# Patient Record
Sex: Male | Born: 1965 | Race: Black or African American | Hispanic: No
Health system: Southern US, Community
[De-identification: ages and names within clinical notes are randomized; demographics above are authoritative.]

## PROBLEM LIST (undated history)

## (undated) DIAGNOSIS — K219 Gastro-esophageal reflux disease without esophagitis: Secondary | ICD-10-CM

## (undated) DIAGNOSIS — E785 Hyperlipidemia, unspecified: Secondary | ICD-10-CM

## (undated) DIAGNOSIS — G4730 Sleep apnea, unspecified: Secondary | ICD-10-CM

## (undated) DIAGNOSIS — G4733 Obstructive sleep apnea (adult) (pediatric): Secondary | ICD-10-CM

## (undated) DIAGNOSIS — R0789 Other chest pain: Secondary | ICD-10-CM

## (undated) DIAGNOSIS — K4090 Unilateral inguinal hernia, without obstruction or gangrene, not specified as recurrent: Secondary | ICD-10-CM

## (undated) DIAGNOSIS — I10 Essential (primary) hypertension: Secondary | ICD-10-CM

## (undated) HISTORY — DX: Gastro-esophageal reflux disease without esophagitis: K21.9

## (undated) HISTORY — PX: HERNIA REPAIR: SHX51

## (undated) HISTORY — DX: Sleep apnea, unspecified: G47.30

## (undated) HISTORY — DX: Obstructive sleep apnea (adult) (pediatric): G47.33

## (undated) HISTORY — PX: COLONOSCOPY: SHX174

## (undated) HISTORY — DX: Unilateral inguinal hernia, without obstruction or gangrene, not specified as recurrent: K40.90

## (undated) HISTORY — PX: POLYPECTOMY: SHX149

## (undated) HISTORY — DX: Hyperlipidemia, unspecified: E78.5

## (undated) HISTORY — DX: Essential (primary) hypertension: I10

## (undated) HISTORY — DX: Other chest pain: R07.89

## (undated) HISTORY — PX: UPPER GASTROINTESTINAL ENDOSCOPY: SHX188

---

## 2004-12-04 ENCOUNTER — Emergency Department (HOSPITAL_COMMUNITY): Admission: EM | Admit: 2004-12-04 | Discharge: 2004-12-04 | Payer: Self-pay | Admitting: Family Medicine

## 2006-03-01 ENCOUNTER — Emergency Department (HOSPITAL_COMMUNITY): Admission: EM | Admit: 2006-03-01 | Discharge: 2006-03-01 | Payer: Self-pay | Admitting: Family Medicine

## 2006-07-10 ENCOUNTER — Emergency Department (HOSPITAL_COMMUNITY): Admission: EM | Admit: 2006-07-10 | Discharge: 2006-07-10 | Payer: Self-pay | Admitting: Family Medicine

## 2010-05-21 ENCOUNTER — Other Ambulatory Visit: Payer: Self-pay | Admitting: Internal Medicine

## 2010-05-21 DIAGNOSIS — R1013 Epigastric pain: Secondary | ICD-10-CM

## 2010-05-21 DIAGNOSIS — R1011 Right upper quadrant pain: Secondary | ICD-10-CM

## 2010-05-22 ENCOUNTER — Ambulatory Visit
Admission: RE | Admit: 2010-05-22 | Discharge: 2010-05-22 | Disposition: A | Discharge status: Home / Self Care | Payer: BC Managed Care – PPO | Source: Ambulatory Visit | Attending: Internal Medicine | Admitting: Internal Medicine

## 2010-05-22 DIAGNOSIS — R1013 Epigastric pain: Secondary | ICD-10-CM

## 2010-05-22 DIAGNOSIS — R1011 Right upper quadrant pain: Secondary | ICD-10-CM

## 2011-11-21 ENCOUNTER — Encounter (INDEPENDENT_AMBULATORY_CARE_PROVIDER_SITE_OTHER): Payer: Self-pay

## 2011-11-21 ENCOUNTER — Encounter (INDEPENDENT_AMBULATORY_CARE_PROVIDER_SITE_OTHER): Payer: Self-pay | Admitting: General Surgery

## 2011-11-21 ENCOUNTER — Ambulatory Visit (INDEPENDENT_AMBULATORY_CARE_PROVIDER_SITE_OTHER): Payer: BC Managed Care – PPO | Admitting: General Surgery

## 2011-11-21 VITALS — BP 120/84 | HR 72 | Temp 98.3°F | Resp 18 | Ht 71.0 in | Wt 206.0 lb

## 2011-11-21 DIAGNOSIS — K4090 Unilateral inguinal hernia, without obstruction or gangrene, not specified as recurrent: Secondary | ICD-10-CM | POA: Insufficient documentation

## 2011-11-21 NOTE — Patient Instructions (Signed)
Try to avoid doing a lot of heavy lifting if you can.

## 2011-11-21 NOTE — Progress Notes (Signed)
Patient ID: Gregory Sanchez, male   DOB: 1965/10/01, 46 y.o.   MRN: [id number]  Chief Complaint  Patient presents with  . New Evaluation    eval hernia    HPI Gregory Sanchez is a 46 y.o. male.   HPI He is referred by Dr. Retta Diones for evaluation of a right inguinal hernia.  He was doing some heavy lifting about 6 weeks ago and noticed a sharp pain in the right groin. Subsequently, he noticed a bulge in the area. He does manually reduce it sometimes when it becomes symptomatic. It does not interfere with his bowel or bladder habits. His father had an inguinal hernia. He denies difficulty with urination or constipation. He is interested in having the hernia repaired.  Past Medical History  Diagnosis Date  . GERD (gastroesophageal reflux disease)   . Hypertension     History reviewed. No pertinent past surgical history.  History reviewed. No pertinent family history.  Social History History  Substance Use Topics  . Smoking status: Current Everyday Smoker -- 0.5 packs/day  . Smokeless tobacco: Not on file  . Alcohol Use: No    No Known Allergies  Current Outpatient Prescriptions  Medication Sig Dispense Refill  . amLODipine-benazepril (LOTREL) 5-20 MG per capsule Take 1 capsule by mouth daily.      Marland Kitchen esomeprazole (NEXIUM) 20 MG capsule Take 40 mg by mouth 2 (two) times daily.         Review of Systems Review of Systems  Constitutional: Negative.   HENT: Negative.   Respiratory: Negative.   Cardiovascular: Negative.   Gastrointestinal: Negative.   Genitourinary: Negative for difficulty urinating.       Right inguinal swelling.  Neurological: Negative.   Hematological: Negative.     Blood pressure 120/84, pulse 72, temperature 98.3 F (36.8 C), resp. rate 18, height 5\' 11"  (1.803 m), weight 206 lb (93.441 kg).  Physical Exam Physical Exam  Constitutional: He appears well-developed and well-nourished. No distress.  HENT:  Head: Normocephalic and atraumatic.    Neck: Neck supple.  Cardiovascular: Normal rate and regular rhythm.   Pulmonary/Chest: Effort normal and breath sounds normal.  Abdominal: Soft. He exhibits no distension and no mass.  Genitourinary:       Reducible right inguinal bulge.  No left inguinal bulge.  No testicular masses.  Skin: Skin is warm and dry.    Data Reviewed None  Assessment    Symptomatic right inguinal hernia. He is interested in repair. We discussed the open repair and a laparoscopic repair both using mesh.    Plan    Open inguinal hernia repair with mesh and placement of On-Q pump.  I have explained the procedure, risks, and aftercare of inguinal hernia repair.  Risks include but are not limited to bleeding, infection, wound problems, anesthesia, recurrence, bladder or intestine injury, urinary retention, testicular dysfunction, chronic pain or numbness in the area, mesh problems.  He seems to understand and agrees to proceed.       Felecity Lemaster J 11/21/2011, 10:29 AM

## 2011-11-29 ENCOUNTER — Other Ambulatory Visit (INDEPENDENT_AMBULATORY_CARE_PROVIDER_SITE_OTHER): Payer: Self-pay | Admitting: General Surgery

## 2011-11-29 ENCOUNTER — Ambulatory Visit
Admission: RE | Admit: 2011-11-29 | Discharge: 2011-11-29 | Disposition: A | Discharge status: Home / Self Care | Payer: BC Managed Care – PPO | Source: Ambulatory Visit | Attending: General Surgery | Admitting: General Surgery

## 2011-11-29 DIAGNOSIS — K4090 Unilateral inguinal hernia, without obstruction or gangrene, not specified as recurrent: Secondary | ICD-10-CM

## 2011-12-13 DIAGNOSIS — K4090 Unilateral inguinal hernia, without obstruction or gangrene, not specified as recurrent: Secondary | ICD-10-CM

## 2011-12-26 ENCOUNTER — Encounter (INDEPENDENT_AMBULATORY_CARE_PROVIDER_SITE_OTHER): Payer: Self-pay

## 2011-12-27 ENCOUNTER — Ambulatory Visit (INDEPENDENT_AMBULATORY_CARE_PROVIDER_SITE_OTHER): Payer: BC Managed Care – PPO | Admitting: General Surgery

## 2011-12-27 ENCOUNTER — Encounter (INDEPENDENT_AMBULATORY_CARE_PROVIDER_SITE_OTHER): Payer: Self-pay | Admitting: General Surgery

## 2011-12-27 VITALS — BP 122/74 | HR 76 | Temp 98.1°F | Resp 13 | Ht 71.0 in | Wt 208.2 lb

## 2011-12-27 DIAGNOSIS — Z9889 Other specified postprocedural states: Secondary | ICD-10-CM

## 2011-12-27 NOTE — Progress Notes (Signed)
He presents for postop followup after open Right inguinal hernia repair with mesh.  Post op pain is improving.  No difficulty voiding or having BMs.  Swelling is decreasing.  P.E.  GU:  Incision clean/dry/intact, swelling is minimal, repair is solid.  Assessment:  Doing well post hernia repair.  Plan:  Continue light activities for 6 weeks postop then slowly start to resume normal activities.  Avoid strenous abdominal exercises for a total of 8 weeks from surgery.  Avoid activities that cause significant discomfort for the long term.  Return visit as needed.

## 2011-12-27 NOTE — Patient Instructions (Signed)
In 4 weeks, you may resume your normal activities as tolerated as we discussed.

## 2012-08-05 ENCOUNTER — Encounter (INDEPENDENT_AMBULATORY_CARE_PROVIDER_SITE_OTHER): Payer: Self-pay | Admitting: General Surgery

## 2012-08-05 ENCOUNTER — Ambulatory Visit (INDEPENDENT_AMBULATORY_CARE_PROVIDER_SITE_OTHER): Payer: BC Managed Care – PPO | Admitting: General Surgery

## 2012-08-05 VITALS — BP 118/88 | HR 64 | Temp 98.8°F | Resp 16 | Ht 71.0 in | Wt 208.8 lb

## 2012-08-05 DIAGNOSIS — IMO0002 Reserved for concepts with insufficient information to code with codable children: Secondary | ICD-10-CM

## 2012-08-05 DIAGNOSIS — S76219A Strain of adductor muscle, fascia and tendon of unspecified thigh, initial encounter: Secondary | ICD-10-CM | POA: Insufficient documentation

## 2012-08-05 NOTE — Patient Instructions (Signed)
You have a right groin strain. Avoid heavy lifting and heavy work for 3-6 weeks until the pain is resolved. Take one  aleve pill twice a day for 2 weeks.  If the pain is noted in 6 weeks, please call and make a followup appointment.

## 2012-08-05 NOTE — Progress Notes (Signed)
Patient ID: Gregory Sanchez, male   DOB: 07-Dec-1965, 48 y.o.   MRN: [id number]  Chief Complaint  Patient presents with  . Follow-up    reck hernia    HPI Gregory Sanchez is a 47 y.o. male.   HPI  He presents today self-referred because of the onset of some sharp right groin pain. This is going on for approximately one to 2 weeks. 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 He tried to place some ice on it. It tends to occur when he is getting up from a sitting position. Once he is walking around it improves. No right inguinal bulges. No dysuria. No testicular pain.  Past Medical History  Diagnosis Date  . GERD (gastroesophageal reflux disease)   . Hypertension   . Inguinal hernia     rt    History reviewed. No pertinent past surgical history.  History reviewed. No pertinent family history.  Social History History  Substance Use Topics  . Smoking status: Former Smoker -- 0.50 packs/day    Quit date: 07/06/2012  . Smokeless tobacco: Not on file  . Alcohol Use: No    Allergies  Allergen Reactions  . Penicillins Hives    Current Outpatient Prescriptions  Medication Sig Dispense Refill  . amLODipine-benazepril (LOTREL) 5-20 MG per capsule Take 1 capsule by mouth daily.      Marland Kitchen esomeprazole (NEXIUM) 20 MG capsule Take 40 mg by mouth 2 (two) times daily.        No current facility-administered medications for this visit.    Review of Systems Review of Systems  Gastrointestinal: Negative for blood in stool and anal bleeding.  Genitourinary: Negative for dysuria, hematuria and testicular pain.    Blood pressure 118/88, pulse 64, temperature 98.8 F (37.1 C), temperature source Oral, resp. rate 16, height 5\' 11"  (1.803 m), weight 208 lb 12.8 oz (94.711 kg).  Physical Exam Physical Exam  Constitutional: He appears well-developed and well-nourished. No distress.  Abdominal: Soft.  No umbilical hernia.  Genitourinary:  Right inguinal scar present.   Note right inguinal bulge. Right inguinal floor is solid without evidence of hernia.    Data Reviewed Previous notes.  Assessment    Acute right groin strain. No evidence of recurrent hernia.     Plan    Nonsteroidal twice a day. Light activity for 3-6 weeks. If he is not better in 6 weeks asked to call back for a followup appointment and repeat examination.       Leilany Digeronimo J 08/05/2012, 10:13 AM

## 2013-12-31 ENCOUNTER — Emergency Department (HOSPITAL_BASED_OUTPATIENT_CLINIC_OR_DEPARTMENT_OTHER)
Admission: EM | Admit: 2013-12-31 | Discharge: 2014-01-01 | Disposition: A | Discharge status: Home / Self Care | Payer: BC Managed Care – PPO | Attending: Emergency Medicine | Admitting: Emergency Medicine

## 2013-12-31 ENCOUNTER — Emergency Department (HOSPITAL_BASED_OUTPATIENT_CLINIC_OR_DEPARTMENT_OTHER): Payer: BC Managed Care – PPO

## 2013-12-31 ENCOUNTER — Encounter (HOSPITAL_BASED_OUTPATIENT_CLINIC_OR_DEPARTMENT_OTHER): Payer: Self-pay | Admitting: Emergency Medicine

## 2013-12-31 DIAGNOSIS — Z880 Allergy status to penicillin: Secondary | ICD-10-CM | POA: Diagnosis not present

## 2013-12-31 DIAGNOSIS — Z87891 Personal history of nicotine dependence: Secondary | ICD-10-CM | POA: Insufficient documentation

## 2013-12-31 DIAGNOSIS — Y990 Civilian activity done for income or pay: Secondary | ICD-10-CM | POA: Diagnosis not present

## 2013-12-31 DIAGNOSIS — Z79899 Other long term (current) drug therapy: Secondary | ICD-10-CM | POA: Diagnosis not present

## 2013-12-31 DIAGNOSIS — X58XXXA Exposure to other specified factors, initial encounter: Secondary | ICD-10-CM | POA: Insufficient documentation

## 2013-12-31 DIAGNOSIS — S8264XA Nondisplaced fracture of lateral malleolus of right fibula, initial encounter for closed fracture: Secondary | ICD-10-CM | POA: Insufficient documentation

## 2013-12-31 DIAGNOSIS — K219 Gastro-esophageal reflux disease without esophagitis: Secondary | ICD-10-CM | POA: Insufficient documentation

## 2013-12-31 DIAGNOSIS — Y9389 Activity, other specified: Secondary | ICD-10-CM | POA: Diagnosis not present

## 2013-12-31 DIAGNOSIS — I10 Essential (primary) hypertension: Secondary | ICD-10-CM | POA: Diagnosis not present

## 2013-12-31 DIAGNOSIS — S99911A Unspecified injury of right ankle, initial encounter: Secondary | ICD-10-CM | POA: Diagnosis present

## 2013-12-31 DIAGNOSIS — S8261XA Displaced fracture of lateral malleolus of right fibula, initial encounter for closed fracture: Secondary | ICD-10-CM

## 2013-12-31 NOTE — ED Notes (Signed)
Pt c/o right ankle injury x 1 day ago 

## 2013-12-31 NOTE — ED Provider Notes (Signed)
CSN: [id number]     Arrival date & time 12/31/13  2140 History  This chart was scribed for Pamella Pert, MD by Starleen Arms, ED Scribe. This patient was seen in room MH07/MH07 and the patient's care was started at 10:08 PM.   Chief Complaint  Patient presents with  . Ankle Injury    Patient is a 48 y.o. male presenting with lower extremity injury. The history is provided by the patient. No language interpreter was used.  Ankle Injury This is a new problem. The current episode started yesterday. The problem occurs constantly. The problem has not changed since onset.Pertinent negatives include no chest pain, no abdominal pain, no headaches and no shortness of breath. The symptoms are aggravated by walking. Nothing relieves the symptoms. He has tried nothing for the symptoms.   HPI Comments: Gregory Sanchez is a 48 y.o. male who presents to the Emergency Department complaining of a twisting right ankle injury sustained yesterday with associated pain and swelling. Patient reports he was at work and rolled over his right ankle and heard a pop.  He is able to walk and bear weight with pain.      Past Medical History  Diagnosis Date  . GERD (gastroesophageal reflux disease)   . Hypertension   . Inguinal hernia     rt   History reviewed. No pertinent past surgical history. History reviewed. No pertinent family history. History  Substance Use Topics  . Smoking status: Former Smoker -- 0.50 packs/day    Quit date: 07/06/2012  . Smokeless tobacco: Not on file  . Alcohol Use: No    Review of Systems  Constitutional: Negative for appetite change and fatigue.  HENT: Negative for congestion, ear discharge and sinus pressure.   Eyes: Negative for discharge.  Respiratory: Negative for cough and shortness of breath.   Cardiovascular: Negative for chest pain.  Gastrointestinal: Negative for abdominal pain and diarrhea.  Genitourinary: Negative for frequency and hematuria.   Musculoskeletal: Positive for arthralgias. Negative for back pain.  Skin: Negative for rash.  Neurological: Negative for seizures and headaches.  Psychiatric/Behavioral: Negative for hallucinations.      Allergies  Penicillins  Home Medications   Prior to Admission medications   Medication Sig Start Date End Date Taking? Authorizing Provider  amLODipine-benazepril (LOTREL) 5-20 MG per capsule Take 1 capsule by mouth daily.    Historical Provider, MD  esomeprazole (NEXIUM) 20 MG capsule Take 40 mg by mouth 2 (two) times daily.     Historical Provider, MD   BP 143/80  Pulse 86  Temp(Src) 98.3 F (36.8 C) (Oral)  Resp 16  Ht 5\' 11"  (1.803 m)  Wt 220 lb (99.791 kg)  BMI 30.70 kg/m2  SpO2 99% Physical Exam  Nursing note and vitals reviewed. Constitutional: He is oriented to person, place, and time. He appears well-developed and well-nourished.  Non-toxic appearance. He does not appear ill. No distress.  HENT:  Head: Normocephalic and atraumatic.  Right Ear: External ear normal.  Left Ear: External ear normal.  Nose: Nose normal. No mucosal edema or rhinorrhea.  Mouth/Throat: Oropharynx is clear and moist and mucous membranes are normal. No dental abscesses or uvula swelling.  Eyes: Conjunctivae and EOM are normal. Pupils are equal, round, and reactive to light.  Neck: Normal range of motion and full passive range of motion without pain. Neck supple.  Cardiovascular: Normal rate, regular rhythm and normal heart sounds.  Exam reveals no gallop and no friction rub.   No murmur  heard. Pulmonary/Chest: Effort normal and breath sounds normal. No respiratory distress. He has no wheezes. He has no rhonchi. He has no rales. He exhibits no tenderness and no crepitus.  Abdominal: Soft. Normal appearance and bowel sounds are normal. He exhibits no distension. There is no tenderness. There is no rebound and no guarding.  Musculoskeletal: Normal range of motion. He exhibits tenderness. He  exhibits no edema.  Mild bruising to the anterior right ankle.  Mild TTP and swelling of the right lateral malleolus. Normal ROM of the right ankle.  2+ pulses in right lower extremity.   Neurological: He is alert and oriented to person, place, and time. He has normal strength. No cranial nerve deficit.  Skin: Skin is warm, dry and intact. No rash noted. No erythema. No pallor.  Psychiatric: He has a normal mood and affect. His speech is normal and behavior is normal. His mood appears not anxious.    ED Course  Procedures (including critical care time)  DIAGNOSTIC STUDIES:   COORDINATION OF CARE:    Labs Review Labs Reviewed - No data to display  Imaging Review Dg Ankle Complete Right  12/31/2013   CLINICAL DATA:  Right ankle injury tonight with lateral pain and swelling. Initial encounter.  EXAM: RIGHT ANKLE - COMPLETE 3+ VIEW  COMPARISON:  None.  FINDINGS: There is soft tissue swelling laterally with an underlying nondisplaced lateral malleolar fracture. No other bony or joint abnormality is identified.  IMPRESSION: Nondisplaced lateral malleolar fracture.   Electronically Signed   By: Inge Rise M.D.   On: 12/31/2013 22:11     EKG Interpretation None      MDM   Final diagnoses:  Lateral malleolar fracture, right, closed, initial encounter    11:30 PM 48 y.o. male here after twisting his ankle yesterday. He was found to have a nondisplaced lateral malleolus fracture. Will place in a Cam Walker and have him followup with orthopedics. He has been ambulatory for the last day with minimal pain.  11:30 PM:  I have discussed the diagnosis/risks/treatment options with the patient and believe the pt to be eligible for discharge home to follow-up with Dr. Tonita Cong. We also discussed returning to the ED immediately if new or worsening sx occur. We discussed the sx which are most concerning (e.g., worsening pain) that necessitate immediate return. Medications administered to the  patient during their visit and any new prescriptions provided to the patient are listed below.  Medications given during this visit Medications - No data to display  New Prescriptions   No medications on file      I personally performed the services described in this documentation, which was scribed in my presence. The recorded information has been reviewed and is accurate.    Pamella Pert, MD 01/01/14 [phone]

## 2013-12-31 NOTE — Discharge Instructions (Signed)
Ankle Fracture  A fracture is a break in a bone. The ankle joint is made up of three bones. These include the lower (distal)sections of your lower leg bones, called the tibia and fibula, along with a bone in your foot, called the talus. Depending on how bad the break is and if more than one ankle joint bone is broken, a cast or splint is used to protect and keep your injured bone from moving while it heals. Sometimes, surgery is required to help the fracture heal properly.   There are two general types of fractures:   Stable fracture. This includes a single fracture line through one bone, with no injury to ankle ligaments. A fracture of the talus that does not have any displacement (movement of the bone on either side of the fracture line) is also stable.   Unstable fracture. This includes more than one fracture line through one or more bones in the ankle joint. It also includes fractures that have displacement of the bone on either side of the fracture line.  CAUSES   A direct blow to the ankle.    Quickly and severely twisting your ankle.   Trauma, such as a car accident or falling from a significant height.  RISK FACTORS  You may be at a higher risk of ankle fracture if:   You have certain medical conditions.   You are involved in high-impact sports.   You are involved in a high-impact car accident.  SIGNS AND SYMPTOMS    Tender and swollen ankle.   Bruising around the injured ankle.   Pain on movement of the ankle.   Difficulty walking or putting weight on the ankle.   A cold foot below the site of the ankle injury. This can occur if the blood vessels passing through your injured ankle were also damaged.   Numbness in the foot below the site of the ankle injury.  DIAGNOSIS   An ankle fracture is usually diagnosed with a physical exam and X-rays. A CT scan may also be required for complex fractures.  TREATMENT   Stable fractures are treated with a cast or splint and using crutches to avoid putting  weight on your injured ankle. This is followed by an ankle strengthening program. Some patients require a special type of cast, depending on other medical problems they may have. Unstable fractures require surgery to ensure the bones heal properly. Your health care provider will tell you what type of fracture you have and the best treatment for your condition.  HOME CARE INSTRUCTIONS    Review correct crutch use with your health care provider and use your crutches as directed. Safe use of crutches is extremely important. Misuse of crutches can cause you to fall or cause injury to nerves in your hands or armpits.   Do not put weight or pressure on the injured ankle until directed by your health care provider.   To lessen the swelling, keep the injured leg elevated while sitting or lying down.   Apply ice to the injured area:   Put ice in a plastic bag.   Place a towel between your cast and the bag.   Leave the ice on for 20 minutes, 2-3 times a day.   If you have a plaster or fiberglass cast:   Do not try to scratch the skin under the cast with any objects. This can increase your risk of skin infection.   Check the skin around the cast every day. You   may put lotion on any red or sore areas.   Keep your cast dry and clean.   If you have a plaster splint:   Wear the splint as directed.   You may loosen the elastic around the splint if your toes become numb, tingle, or turn cold or blue.   Do not put pressure on any part of your cast or splint; it may break. Rest your cast only on a pillow the first 24 hours until it is fully hardened.   Your cast or splint can be protected during bathing with a plastic bag sealed to your skin with medical tape. Do not lower the cast or splint into water.   Take medicines as directed by your health care provider. Only take over-the-counter or prescription medicines for pain, discomfort, or fever as directed by your health care provider.   Do not drive a vehicle until  your health care provider specifically tells you it is safe to do so.   If your health care provider has given you a follow-up appointment, it is very important to keep that appointment. Not keeping the appointment could result in a chronic or permanent injury, pain, and disability. If you have any problem keeping the appointment, call the facility for assistance.  SEEK MEDICAL CARE IF:  You develop increased swelling or discomfort.  SEEK IMMEDIATE MEDICAL CARE IF:    Your cast gets damaged or breaks.   You have continued severe pain.   You develop new pain or swelling after the cast was put on.   Your skin or toenails below the injury turn blue or gray.   Your skin or toenails below the injury feel cold, numb, or have loss of sensitivity to touch.   There is a bad smell or pus draining from under the cast.  MAKE SURE YOU:    Understand these instructions.   Will watch your condition.   Will get help right away if you are not doing well or get worse.  Document Released: 03/01/2000 Document Revised: 03/09/2013 Document Reviewed: 10/01/2012  Exit[employer] Patient Information 2015 [employer]. This information is not intended to replace advice given to you by your health care provider. Make sure you discuss any questions you have with your health care provider.

## 2014-10-27 ENCOUNTER — Ambulatory Visit (INDEPENDENT_AMBULATORY_CARE_PROVIDER_SITE_OTHER): Payer: BLUE CROSS/BLUE SHIELD | Admitting: Neurology

## 2014-10-27 ENCOUNTER — Encounter: Payer: Self-pay | Admitting: Neurology

## 2014-10-27 VITALS — BP 122/82 | HR 86 | Resp 20 | Ht 71.0 in | Wt 230.0 lb

## 2014-10-27 DIAGNOSIS — G4710 Hypersomnia, unspecified: Secondary | ICD-10-CM | POA: Diagnosis not present

## 2014-10-27 DIAGNOSIS — G4733 Obstructive sleep apnea (adult) (pediatric): Secondary | ICD-10-CM | POA: Diagnosis not present

## 2014-10-27 DIAGNOSIS — G4730 Sleep apnea, unspecified: Secondary | ICD-10-CM

## 2014-10-27 DIAGNOSIS — R0683 Snoring: Secondary | ICD-10-CM | POA: Diagnosis not present

## 2014-10-27 NOTE — Patient Instructions (Signed)
Sleep Apnea  Sleep apnea is a sleep disorder characterized by abnormal pauses in breathing while you sleep. When your breathing pauses, the level of oxygen in your blood decreases. This causes you to move out of deep sleep and into light sleep. As a result, your quality of sleep is poor, and the system that carries your blood throughout your body (cardiovascular system) experiences stress. If sleep apnea remains untreated, the following conditions can develop:  High blood pressure (hypertension).  Coronary artery disease.  Inability to achieve or maintain an erection (impotence).  Impairment of your thought process (cognitive dysfunction). There are three types of sleep apnea: 1. Obstructive sleep apnea--Pauses in breathing during sleep because of a blocked airway. 2. Central sleep apnea--Pauses in breathing during sleep because the area of the brain that controls your breathing does not send the correct signals to the muscles that control breathing. 3. Mixed sleep apnea--A combination of both obstructive and central sleep apnea. RISK FACTORS The following risk factors can increase your risk of developing sleep apnea:  Being overweight.  Smoking.  Having narrow passages in your nose and throat.  Being of older age.  Being male.  Alcohol use.  Sedative and tranquilizer use.  Ethnicity. Among individuals younger than 35 years, African Americans are at increased risk of sleep apnea. SYMPTOMS   Difficulty staying asleep.  Daytime sleepiness and fatigue.  Loss of energy.  Irritability.  Loud, heavy snoring.  Morning headaches.  Trouble concentrating.  Forgetfulness.  Decreased interest in sex. DIAGNOSIS  In order to diagnose sleep apnea, your caregiver will perform a physical examination. Your caregiver may suggest that you take a home sleep test. Your caregiver may also recommend that you spend the night in a sleep lab. In the sleep lab, several monitors record  information about your heart, lungs, and brain while you sleep. Your leg and arm movements and blood oxygen level are also recorded. TREATMENT The following actions may help to resolve mild sleep apnea:  Sleeping on your side.   Using a decongestant if you have nasal congestion.   Avoiding the use of depressants, including alcohol, sedatives, and narcotics.   Losing weight and modifying your diet if you are overweight. There also are devices and treatments to help open your airway:  Oral appliances. These are custom-made mouthpieces that shift your lower jaw forward and slightly open your bite. This opens your airway.  Devices that create positive airway pressure. This positive pressure "splints" your airway open to help you breathe better during sleep. The following devices create positive airway pressure:  Continuous positive airway pressure (CPAP) device. The CPAP device creates a continuous level of air pressure with an air pump. The air is delivered to your airway through a mask while you sleep. This continuous pressure keeps your airway open.  Nasal expiratory positive airway pressure (EPAP) device. The EPAP device creates positive air pressure as you exhale. The device consists of single-use valves, which are inserted into each nostril and held in place by adhesive. The valves create very little resistance when you inhale but create much more resistance when you exhale. That increased resistance creates the positive airway pressure. This positive pressure while you exhale keeps your airway open, making it easier to breath when you inhale again.  Bilevel positive airway pressure (BPAP) device. The BPAP device is used mainly in patients with central sleep apnea. This device is similar to the CPAP device because it also uses an air pump to deliver continuous air pressure   through a mask. However, with the BPAP machine, the pressure is set at two different levels. The pressure when you  exhale is lower than the pressure when you inhale.  Surgery. Typically, surgery is only done if you cannot comply with less invasive treatments or if the less invasive treatments do not improve your condition. Surgery involves removing excess tissue in your airway to create a wider passage way. Document Released: 02/22/2002 Document Revised: 06/29/2012 Document Reviewed: 07/11/2011 [employer] Patient Information 2015 [employer]. This information is not intended to replace advice given to you by your health care provider. Make sure you discuss any questions you have with your health care provider.  

## 2014-10-27 NOTE — Progress Notes (Signed)
[facility]   Provider:  Larey Seat, M D  Referring Provider: Marton Redwood, MD Primary Care Physician:  Marton Redwood, MD  Chief Complaint  Patient presents with  . sleep consult    rm 11, alone, snoring    HPI:  Gregory Sanchez is a 49 y.o. male , seen here as a referral from Dr. 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 He reports that he has for a long time felt not restored or refreshed in the morning -on the contrary he feels tired, as if he needs more sleep. Some nights he reports tossing and turning in bed not feeling able to enter sleep. He was already once evaluated for sleep apnea and referred to [facility] [place]. A little over 6 years ago on 7-20 7-10 he underwent a diagnostic polysomnography at the time his BMI was 29.4, his neck circumference 16 inches, and his Epworth sleepiness score endorsed at 4 points. He had reported waking up this morning headaches feeling drowsy and having a dry mouth and he had been told that he snored he also had frequent bathroom breaks. His sleep study revealed only an AHI of 5.8 and in supine sleep position of 16.9. He also had frequent periodic limb movements that caused arousals at a frequency of 6.9 per hour of sleep and for therefore clinically significant at the time. Commended for him to avoid sleeping in supine position.  Over the last 6 years the patient reports having gained some weight he was referred with a body weight of 231 pounds and he was 20 pounds lighter 6 years ago. His snoring has increased and is feeling of tiredness has increased.  Sleep habits are as follows: The patient prefers to sleep on his stomach or on his side, he usually goes to bed around 9 to 9:30 PM. On average it'll take him 20-30 minutes to fall asleep. He will wake up between 1 AM  and 1:30 AM to go to the bathroom, usually just once at night. He feels that it is difficult for him to fall asleep again after the bathroom break. He usually goes to bed and just lays there but he does not leaves the bedroom or distract himself with any other measures. He does not watch TV he does not read. His bedroom is core, quiet and dark. He rises at 4:00 in the morning, he relies on an alarm and is not waking up spontaneously. He does not take breakfast before commuting to work. He works from 5 AM to 5 PM in [employer], mostly outdoors.  He shares a bedroom with his girlfriend who has observed him snoring and moving. Kicking frequently, but she has not reported apneas.   Sleep medical history and family sleep history:  All family members snore, but nobody carries the diagnosis of OSA. Social history:  Lives with girlfriend, no caffeine intake, no ETOH, non smoker.   Review of Systems: Out of a complete 14 system review, the patient complains of only the following symptoms, and all other reviewed systems are negative. Snoring, feeling a decreased level of energy, not enough sleep weight gain. Epworth sleepiness score endorsed at 19 points which is excessive, fatigue was not endorsed as high, at 25 points. He denies any symptoms of depression.    Social History   Social History  . Marital  Status: Single    Spouse Name: N/A  . Number of Children: N/A  . Years of Education: N/A   Occupational History  . Not on file.   Social History Main Topics  . Smoking status: Former Smoker -- 0.50 packs/day    Quit date: 07/06/2012  . Smokeless tobacco: Not on file  . Alcohol Use: No  . Drug Use: No  . Sexual Activity: Not on file   Other Topics Concern  . Not on file   Social History Narrative   Denies caffeine use.    Family History  Problem Relation Age of Onset  . Hypertension Mother   . Hypertension Father     Past Medical History  Diagnosis Date  . GERD (gastroesophageal  reflux disease)   . Hypertension   . Inguinal hernia     rt  . Atypical chest pain   . Sleep apnea, obstructive     Past Surgical History  Procedure Laterality Date  . Hernia repair      Current Outpatient Prescriptions  Medication Sig Dispense Refill  . amLODipine-benazepril (LOTREL) 5-20 MG per capsule Take 1 capsule by mouth daily.    Marland Kitchen aspirin 325 MG tablet Take 325 mg by mouth daily.    Marland Kitchen esomeprazole (NEXIUM) 20 MG capsule Take 40 mg by mouth 2 (two) times daily.      No current facility-administered medications for this visit.    Allergies as of 10/27/2014 - Review Complete 10/27/2014  Allergen Reaction Noted  . Penicillins Hives 08/05/2012    Vitals: BP 122/82 mmHg  Pulse 86  Resp 20  Ht 5\' 11"  (1.803 m)  Wt 230 lb (104.327 kg)  BMI 32.09 kg/m2 Last Weight:  Wt Readings from Last 1 Encounters:  10/27/14 230 lb (104.327 kg)   TFT:DDUK mass index is 32.09 kg/(m^2).     Last Height:   Ht Readings from Last 1 Encounters:  10/27/14 5\' 11"  (1.803 m)    Physical exam:  General: The patient is awake, alert and appears not in acute distress. The patient is well groomed. Head: Normocephalic, atraumatic. Neck is supple. Mallampati 4 ,  neck circumference:17. Nasal airflow unrestricted , TMJ click is not  evident . Retrognathia is seen.  Cardiovascular:  Regular rate and rhythm , without  murmurs or carotid bruit, and without distended neck veins. Respiratory: Lungs are clear to auscultation. Skin:  Without evidence of edema, or rash Trunk: BMI is elevated . The patient's posture is erect. Neurologic exam : The patient is awake and alert, oriented to place and time.   Attention span & concentration ability appears normal.  Speech is fluent,  without dysarthria, dysphonia or aphasia.  Mood and affect are appropriate.  Cranial nerves: Pupils are equal and briskly reactive to light. Funduscopic exam without   evidence of pallor or edema. Extraocular movements  in  vertical and horizontal planes intact and without nystagmus. Visual fields by finger perimetry are intact. Hearing to finger rub intact.   Facial sensation intact to fine touch.  Facial motor strength is symmetric and tongue and uvula move midline. Shoulder shrug was symmetrical.   Motor exam:  Normal tone, muscle bulk and symmetric strength in all extremities.  Sensory:  Fine touch, pinprick and vibration were tested in all extremities. Proprioception tested in the upper extremities was normal.  Coordination: Rapid alternating movements in the fingers/hands was normal. Finger-to-nose maneuver  normal without evidence of ataxia, dysmetria or tremor.  Gait and station: Patient walks without assistive  device and is able unassisted to climb up to the exam table. Strength within normal limits.  Stance is stable and normal.  Toe and heel stand were tested .  Turns with 3  Steps. Romberg testing is  negative.  Deep tendon reflexes: in the  upper and lower extremities are symmetric and intact. Babinski maneuver response is  downgoing.  The patient was advised of the nature of the diagnosed sleep disorder , the treatment options and risks for general a health and wellness arising from not treating the condition.  I spent more than 30 minutes of face to face time with the patient. Greater than 50% of time was spent in counseling and coordination of care. We have discussed the diagnosis and differential and I answered the patient's questions.     Assessment:  After physical and neurologic examination, review of laboratory studies,  Personal review of imaging studies, reports of other /same  Imaging studies ,  Results of polysomnography/ neurophysiology testing and pre-existing records as far as provided , my assessment is   1) Mr. Wafer is at a higher risk of having sleep apnea that he was 6 years ago. His risk factors are related to weight gain and age male gender and the finding of retrognathia. His  girlfriend's report of frequently kicking and moving in bed as well as snoring loudly are  indications of a higher risk of sleep apnea being present. He doesn't report morning headaches, as he had 6 years ago, and no longer nocturia.   Plan:  Treatment plan and additional workup :  PSG Split , AHI 15 and score at 3% for [employer]. Rv with me after test . Should again a mild form of sleep apnea without significant oxygen desaturation be found, Mr. Weathers could be an ideal candidate for a dental device that would advance his mandibular. Again the treatment options for sleep apnea, if found doing sleep study, vary by the degree, and comorbidities.     Cc Dr Ivin Booty Adelaine Roppolo MD  10/27/2014   CC: Marton Redwood, [place] [place], [place] [id number]

## 2014-11-15 ENCOUNTER — Ambulatory Visit (INDEPENDENT_AMBULATORY_CARE_PROVIDER_SITE_OTHER): Payer: BLUE CROSS/BLUE SHIELD | Admitting: Neurology

## 2014-11-15 DIAGNOSIS — G4733 Obstructive sleep apnea (adult) (pediatric): Secondary | ICD-10-CM | POA: Diagnosis not present

## 2014-11-15 DIAGNOSIS — G4730 Sleep apnea, unspecified: Secondary | ICD-10-CM

## 2014-11-15 DIAGNOSIS — G4710 Hypersomnia, unspecified: Secondary | ICD-10-CM

## 2014-11-15 DIAGNOSIS — R0683 Snoring: Secondary | ICD-10-CM

## 2014-11-15 NOTE — Sleep Study (Signed)
Please see the scanned sleep study interpretation located in the Procedure tab within the Chart Review section. 

## 2014-11-16 ENCOUNTER — Telehealth: Payer: Self-pay

## 2014-11-16 DIAGNOSIS — G4733 Obstructive sleep apnea (adult) (pediatric): Secondary | ICD-10-CM

## 2014-11-16 NOTE — Telephone Encounter (Signed)
Called and spoke to pt regarding his sleep study results. Advised him that osa was seen in his study and Dr. Brett Fairy recommends starting cpap to treat the osa. Pt is willing to proceed. I advised pt that I will send his information and an order to a DME and they will contact him for set up. I advised pt to wear cpap at least four or more hours nightly. An appt was made with pt for 10/31 at 8:00 to follow up with Dr. Brett Fairy for insurance purposes. Pt verbalized understanding to arrive 15 minutes early to his appt and bring his cpap.

## 2015-01-10 ENCOUNTER — Telehealth: Payer: Self-pay

## 2015-01-10 NOTE — Telephone Encounter (Signed)
Spoke to [facility]. They advised me that pt declined to set up cpap because of financial constraints, and he would call back and make an appt to get it set up when he got his finances in order.  I called pt to discuss if he wanted to cancel his appt for 10/31 since he has not started his cpap. No answer, left a message asking him to call me back.

## 2015-01-16 ENCOUNTER — Ambulatory Visit: Payer: Self-pay | Admitting: Neurology

## 2015-01-16 ENCOUNTER — Telehealth: Payer: Self-pay

## 2015-01-16 NOTE — Telephone Encounter (Signed)
Pt did not show for their appt with Dr. Dohmeier today.  

## 2015-01-17 ENCOUNTER — Encounter: Payer: Self-pay | Admitting: Neurology

## 2015-04-27 ENCOUNTER — Emergency Department (HOSPITAL_COMMUNITY): Payer: BLUE CROSS/BLUE SHIELD

## 2015-04-27 ENCOUNTER — Emergency Department (HOSPITAL_COMMUNITY)
Admission: EM | Admit: 2015-04-27 | Discharge: 2015-04-27 | Disposition: A | Discharge status: Home / Self Care | Payer: BLUE CROSS/BLUE SHIELD | Attending: Emergency Medicine | Admitting: Emergency Medicine

## 2015-04-27 ENCOUNTER — Encounter (HOSPITAL_COMMUNITY): Payer: Self-pay | Admitting: Emergency Medicine

## 2015-04-27 DIAGNOSIS — Z880 Allergy status to penicillin: Secondary | ICD-10-CM | POA: Diagnosis not present

## 2015-04-27 DIAGNOSIS — Z8669 Personal history of other diseases of the nervous system and sense organs: Secondary | ICD-10-CM | POA: Diagnosis not present

## 2015-04-27 DIAGNOSIS — I10 Essential (primary) hypertension: Secondary | ICD-10-CM | POA: Diagnosis not present

## 2015-04-27 DIAGNOSIS — Z7982 Long term (current) use of aspirin: Secondary | ICD-10-CM | POA: Diagnosis not present

## 2015-04-27 DIAGNOSIS — K219 Gastro-esophageal reflux disease without esophagitis: Secondary | ICD-10-CM | POA: Insufficient documentation

## 2015-04-27 DIAGNOSIS — Z79899 Other long term (current) drug therapy: Secondary | ICD-10-CM | POA: Insufficient documentation

## 2015-04-27 DIAGNOSIS — Z87891 Personal history of nicotine dependence: Secondary | ICD-10-CM | POA: Diagnosis not present

## 2015-04-27 DIAGNOSIS — R079 Chest pain, unspecified: Secondary | ICD-10-CM | POA: Diagnosis present

## 2015-04-27 DIAGNOSIS — R0789 Other chest pain: Secondary | ICD-10-CM

## 2015-04-27 LAB — BASIC METABOLIC PANEL
Anion gap: 11 (ref 5–15)
BUN: 16 mg/dL (ref 6–20)
CALCIUM: 9.3 mg/dL (ref 8.9–10.3)
CO2: 25 mmol/L (ref 22–32)
CREATININE: 1.12 mg/dL (ref 0.61–1.24)
Chloride: 102 mmol/L (ref 101–111)
Glucose, Bld: 100 mg/dL — ABNORMAL HIGH (ref 65–99)
Potassium: 4.1 mmol/L (ref 3.5–5.1)
SODIUM: 138 mmol/L (ref 135–145)

## 2015-04-27 LAB — CBC
HCT: 40.3 % (ref 39.0–52.0)
Hemoglobin: 13.6 g/dL (ref 13.0–17.0)
MCH: 29.3 pg (ref 26.0–34.0)
MCHC: 33.7 g/dL (ref 30.0–36.0)
MCV: 86.9 fL (ref 78.0–100.0)
PLATELETS: 312 10*3/uL (ref 150–400)
RBC: 4.64 MIL/uL (ref 4.22–5.81)
RDW: 13.7 % (ref 11.5–15.5)
WBC: 5.7 10*3/uL (ref 4.0–10.5)

## 2015-04-27 LAB — I-STAT TROPONIN, ED: Troponin i, poc: 0 ng/mL (ref 0.00–0.08)

## 2015-04-27 LAB — TROPONIN I: Troponin I: <0.03 ng/mL (ref <0.031)

## 2015-04-27 MED ORDER — IBUPROFEN 600 MG PO TABS: 600.0000 mg | ORAL_TABLET | Freq: Four times a day (QID) | ORAL | Status: DC | PRN

## 2015-04-27 NOTE — ED Notes (Signed)
Per EMS:  Pt presents to ED from [place] after experiencing chest pain on the left side, sharp radiating down left arm.  Pt sts chest pain began last night, was intermittent.  No cardiac history, family history of MI before 72.  Pt given ASA and 1 nitro with chest pain resolution.

## 2015-04-27 NOTE — Discharge Instructions (Signed)
You were seen in the ED for chest pain.  You had an EKG, chest xray, and labs that did not suggest that this was coming from your heart.  However, I recommend that you see your primary care doctor in the next week.  You will likely need a referral for an outpatient cardiac stress test.   Nonspecific Chest Pain It is often hard to find the cause of chest pain. There is always a chance that your pain could be related to something serious, such as a heart attack or a blood clot in your lungs. Chest pain can also be caused by conditions that are not life-threatening. If you have chest pain, it is very important to follow up with your doctor.  HOME CARE  If you were prescribed an antibiotic medicine, finish it all even if you start to feel better.  Avoid any activities that cause chest pain.  Do not use any tobacco products, including cigarettes, chewing tobacco, or electronic cigarettes. If you need help quitting, ask your doctor.  Do not drink alcohol.  Take medicines only as told by your doctor.  Keep all follow-up visits as told by your doctor. This is important. This includes any further testing if your chest pain does not go away.  Your doctor may tell you to keep your head raised (elevated) while you sleep.  Make lifestyle changes as told by your doctor. These may include:  Getting regular exercise. Ask your doctor to suggest some activities that are safe for you.  Eating a heart-healthy diet. Your doctor or a diet specialist (dietitian) can help you to learn healthy eating options.  Maintaining a healthy weight.  Managing diabetes, if necessary.  Reducing stress. GET HELP IF:  Your chest pain does not go away, even after treatment.  You have a rash with blisters on your chest.  You have a fever. GET HELP RIGHT AWAY IF:  Your chest pain is worse.  You have an increasing cough, or you cough up blood.  You have severe belly (abdominal) pain.  You feel extremely  weak.  You pass out (faint).  You have chills.  You have sudden, unexplained chest discomfort.  You have sudden, unexplained discomfort in your arms, back, neck, or jaw.  You have shortness of breath at any time.  You suddenly start to sweat, or your skin gets clammy.  You feel nauseous.  You vomit.  You suddenly feel light-headed or dizzy.  Your heart begins to beat quickly, or it feels like it is skipping beats. These symptoms may be an emergency. Do not wait to see if the symptoms will go away. Get medical help right away. Call your local emergency services (911 in the [place] Do not drive yourself to the hospital.   This information is not intended to replace advice given to you by your health care provider. Make sure you discuss any questions you have with your health care provider.   Document Released: 08/21/2007 Document Revised: 03/25/2014 Document Reviewed: 10/08/2013 Elsevier Interactive Patient Education [employer].

## 2015-04-27 NOTE — ED Provider Notes (Signed)
I saw and evaluated the patient, reviewed the resident's note and I agree with the findings and plan. Please see associated encounter note.   EKG Interpretation   Date/Time:  Thursday April 27 2015 16:40:57 EST Ventricular Rate:  71 PR Interval:  176 QRS Duration: 88 QT Interval:  387 QTC Calculation: 420 R Axis:   51 Text Interpretation:  Sinus rhythm Benign early repolarization No  significant change since last tracing Confirmed by Carmaleta Youngers MD, Sabree Nuon  [phone]) on 04/27/2015 4:50:02 PM      50 y.o. male presents with atypical chest pain described as left-sided, sharp, reproducible, with pain and radiation distally over his arm made worse with direct pressure over his anterior shoulder. EKG shows early re-pole unchanged from prior. Heart score is 4 with this finding but given highly atypical features and 2 negative troponins he is appropriate for outpatient follow-up on a trial of NSAIDs.  Leo Grosser, MD 04/28/15 [place]

## 2015-04-27 NOTE — ED Provider Notes (Signed)
CSN: [id number]     Arrival date & time 04/27/15  1633 History   First MD Initiated Contact with Patient 04/27/15 1642     Chief Complaint  Patient presents with  . Chest Pain   HPI Comments: Per EMS: Pt presents to ED from [place] after experiencing chest pain on the left side, sharp radiating down left arm. Pt sts chest pain began last night, was intermittent. No cardiac history, family history of MI before 87. Pt given ASA and 1 nitro with chest pain resolution.  Patient reports that chest pain started last evening.  He described CP as left sided radiating down his L arm.  Pain was worse with activity.  It was intermittent and returned this morning at work.  CP relieved by Nitro and ASA given by EMS. No SOB, nausea, vomiting, diaphoresis, visual disturbance, weakness.  He reports a family h/o MI in his mother at 37 yo.  His brother had a CVA at 99 yo.  Patient is treated for HTN and acid reflux.  Does not smoke, drink or use illicit substances.  Patient is a 50 y.o. male presenting with chest pain. The history is provided by the patient. No language interpreter was used.  Chest Pain Associated symptoms: no abdominal pain, no diaphoresis, no dizziness, no nausea, no numbness, no shortness of breath, not vomiting and no weakness     Past Medical History  Diagnosis Date  . GERD (gastroesophageal reflux disease)   . Hypertension   . Inguinal hernia     rt  . Atypical chest pain   . Sleep apnea, obstructive    Past Surgical History  Procedure Laterality Date  . Hernia repair     Family History  Problem Relation Age of Onset  . Hypertension Mother   . Hypertension Father    Social History  Substance Use Topics  . Smoking status: Former Smoker -- 0.50 packs/day    Quit date: 07/06/2012  . Smokeless tobacco: None  . Alcohol Use: No    Review of Systems  Constitutional: Negative for diaphoresis.  Eyes: Negative for visual disturbance.  Respiratory: Negative for shortness of  breath.   Cardiovascular: Positive for chest pain.  Gastrointestinal: Negative for nausea, vomiting and abdominal pain.  Musculoskeletal: Positive for myalgias (left arm pain that has resolved).  Neurological: Negative for dizziness, weakness and numbness.   Allergies  Penicillins  Home Medications   Prior to Admission medications   Medication Sig Start Date End Date Taking? Authorizing Provider  amLODipine-benazepril (LOTREL) 5-20 MG per capsule Take 1 capsule by mouth daily.   Yes Historical Provider, MD  aspirin 325 MG tablet Take 325 mg by mouth daily.   Yes Historical Provider, MD  esomeprazole (NEXIUM) 20 MG capsule Take 20 mg by mouth 2 (two) times daily.    Yes Historical Provider, MD  Multiple Vitamin (MULTIVITAMIN WITH MINERALS) TABS tablet Take 1 tablet by mouth daily.   Yes Historical Provider, MD  triamcinolone cream (KENALOG) 0.1 % Apply 1 application topically daily as needed (affected area).  04/21/15  Yes Historical Provider, MD   BP 107/90 mmHg  Pulse 69  Resp 18  Ht 5\' 11"  (1.803 m)  Wt 102.059 kg  BMI 31.39 kg/m2  SpO2 100% Physical Exam  Constitutional: He is oriented to person, place, and time. He appears well-developed and well-nourished. No distress.  HENT:  Head: Normocephalic and atraumatic.  Mouth/Throat: Oropharynx is clear and moist. No oropharyngeal exudate.  Eyes: Conjunctivae and EOM are normal.  Pupils are equal, round, and reactive to light.  Neck: Normal range of motion. Neck supple.  Cardiovascular: Normal rate, regular rhythm, normal heart sounds and intact distal pulses.   No murmur heard. Pulmonary/Chest: Effort normal and breath sounds normal. No respiratory distress. He has no wheezes.  Abdominal: Soft. Bowel sounds are normal. There is no tenderness.  Musculoskeletal: Normal range of motion. He exhibits no edema.  Neurological: He is alert and oriented to person, place, and time.  Skin: Skin is warm and dry. He is not diaphoretic.   Psychiatric: He has a normal mood and affect.    ED Course  Procedures (including critical care time) Labs Review Labs Reviewed  BASIC METABOLIC PANEL - Abnormal; Notable for the following:    Glucose, Bld 100 (*)    All other components within normal limits  CBC  TROPONIN I  I-STAT TROPOININ, ED   Imaging Review Dg Chest 2 View  04/27/2015  CLINICAL DATA:  Acute left chest pain today. EXAM: CHEST  2 VIEW COMPARISON:  November 29, 2011. FINDINGS: The heart size and mediastinal contours are within normal limits. Both lungs are clear. The visualized skeletal structures are unremarkable. IMPRESSION: No active cardiopulmonary disease. Electronically Signed   By: Abelardo Diesel M.D.   On: 04/27/2015 17:07   I have personally reviewed and evaluated these images and lab results as part of my medical decision-making.   EKG Interpretation   Date/Time:  Thursday April 27 2015 16:40:57 EST Ventricular Rate:  71 PR Interval:  176 QRS Duration: 88 QT Interval:  387 QTC Calculation: 420 R Axis:   51 Text Interpretation:  Sinus rhythm Benign early repolarization No  significant change since last tracing Confirmed by KNOTT MD, DANIEL  [id number]) on 04/27/2015 4:50:02 PM      MDM   Final diagnoses:  Chest pain, unspecified chest pain type   1648: CBC, BMP, iTrop, CXR ordered.  EKG without ischemia.  1714: CXR normal. Labs pending.  1821: iTrop neg.  Serum trop ordered for 1955.  Patient continues to do well.  Resting comfortably in bed.  VSS.  Donavyn Bennink Ivie is a 50 y.o. male that presents with chest pain.  Chest pain and L arm pain reproducible with elevation of L arm and palpation to the chest.  CXR negative for acute processes.  Troponin negative x2.  EKG without evidence of ischemia.  Very low suspicion for cardiac etiology of pain at this time.  Patient, however, would benefit from outpatient stress test with Heart score of 4.  Discharged patient with Motrin 600mg  for pain.   Return precautions reviewed with patient.  Advised patient to follow up with PCP in next week or so.  Patient voiced good understanding and was discharged in stable condition home.    Janora Norlander, DO 04/27/15 2148  Leo Grosser, MD 04/28/15 [phone]

## 2015-05-01 ENCOUNTER — Other Ambulatory Visit (HOSPITAL_COMMUNITY): Payer: Self-pay | Admitting: Internal Medicine

## 2015-05-01 DIAGNOSIS — R079 Chest pain, unspecified: Secondary | ICD-10-CM

## 2015-05-03 ENCOUNTER — Telehealth (HOSPITAL_COMMUNITY): Payer: Self-pay

## 2015-05-03 NOTE — Telephone Encounter (Signed)
Encounter complete. 

## 2015-05-05 ENCOUNTER — Ambulatory Visit (HOSPITAL_COMMUNITY)
Admission: RE | Admit: 2015-05-05 | Discharge: 2015-05-05 | Disposition: A | Discharge status: Home / Self Care | Payer: BLUE CROSS/BLUE SHIELD | Source: Ambulatory Visit | Attending: Cardiology | Admitting: Cardiology

## 2015-05-05 DIAGNOSIS — I10 Essential (primary) hypertension: Secondary | ICD-10-CM | POA: Insufficient documentation

## 2015-05-05 DIAGNOSIS — Z87891 Personal history of nicotine dependence: Secondary | ICD-10-CM | POA: Insufficient documentation

## 2015-05-05 DIAGNOSIS — Z8249 Family history of ischemic heart disease and other diseases of the circulatory system: Secondary | ICD-10-CM | POA: Diagnosis not present

## 2015-05-05 DIAGNOSIS — R079 Chest pain, unspecified: Secondary | ICD-10-CM | POA: Diagnosis not present

## 2015-05-05 DIAGNOSIS — R5383 Other fatigue: Secondary | ICD-10-CM | POA: Insufficient documentation

## 2015-05-05 DIAGNOSIS — G4733 Obstructive sleep apnea (adult) (pediatric): Secondary | ICD-10-CM | POA: Diagnosis not present

## 2015-05-05 LAB — MYOCARDIAL PERFUSION IMAGING
Estimated workload: 10.6 METS
Exercise duration (min): 10 min
Exercise duration (sec): 45 s
LV dias vol: 102 mL
LV sys vol: 42 mL
MPHR: 171 {beats}/min
Peak HR: 150 {beats}/min
Percent HR: 87 %
RPE: 17
Rest HR: 63 {beats}/min
SDS: 0
SRS: 1
SSS: 1
TID: 0.93

## 2015-05-05 MED ORDER — TECHNETIUM TC 99M SESTAMIBI GENERIC - CARDIOLITE
32.1000 | Freq: Once | INTRAVENOUS | Status: AC | PRN
  Administered 2015-05-05: 32.1 via INTRAVENOUS

## 2015-05-05 MED ORDER — TECHNETIUM TC 99M SESTAMIBI GENERIC - CARDIOLITE
10.5000 | Freq: Once | INTRAVENOUS | Status: AC | PRN
  Administered 2015-05-05: 11 via INTRAVENOUS

## 2015-06-21 DIAGNOSIS — L299 Pruritus, unspecified: Secondary | ICD-10-CM | POA: Insufficient documentation

## 2015-06-21 DIAGNOSIS — H6240 Otitis externa in other diseases classified elsewhere, unspecified ear: Secondary | ICD-10-CM | POA: Insufficient documentation

## 2015-09-21 ENCOUNTER — Encounter: Payer: Self-pay | Admitting: Gastroenterology

## 2015-10-17 ENCOUNTER — Encounter (INDEPENDENT_AMBULATORY_CARE_PROVIDER_SITE_OTHER): Payer: Self-pay

## 2015-10-17 ENCOUNTER — Ambulatory Visit (AMBULATORY_SURGERY_CENTER): Payer: Self-pay | Admitting: *Deleted

## 2015-10-17 VITALS — Ht 70.0 in | Wt 219.6 lb

## 2015-10-17 DIAGNOSIS — Z1211 Encounter for screening for malignant neoplasm of colon: Secondary | ICD-10-CM

## 2015-10-17 MED ORDER — SUPREP BOWEL PREP KIT 17.5-3.13-1.6 GM/177ML PO SOLN
1.0000 | Freq: Once | ORAL | 0 refills | Status: AC
Start: 2015-10-17 — End: 2015-10-17

## 2015-10-17 NOTE — Progress Notes (Signed)
Patient denies any allergies to egg or soy products. Patient denies complications with anesthesia/sedation.  Patient denies oxygen use at home and denies diet medications.  Mild OSA - patient does not have CPAP.  Emmi instructions for colonoscopy explained but patient denied.  Pamphlet given.

## 2015-10-18 ENCOUNTER — Encounter: Payer: Self-pay | Admitting: Gastroenterology

## 2015-10-31 ENCOUNTER — Encounter: Payer: Self-pay | Admitting: Gastroenterology

## 2015-10-31 ENCOUNTER — Ambulatory Visit (AMBULATORY_SURGERY_CENTER): Payer: BLUE CROSS/BLUE SHIELD | Admitting: Gastroenterology

## 2015-10-31 VITALS — BP 118/72 | HR 61 | Temp 99.3°F | Resp 15 | Ht 70.0 in | Wt 219.0 lb

## 2015-10-31 DIAGNOSIS — Z1210 Encounter for screening for malignant neoplasm of intestinal tract, unspecified: Secondary | ICD-10-CM

## 2015-10-31 DIAGNOSIS — K635 Polyp of colon: Secondary | ICD-10-CM

## 2015-10-31 DIAGNOSIS — D120 Benign neoplasm of cecum: Secondary | ICD-10-CM

## 2015-10-31 DIAGNOSIS — D123 Benign neoplasm of transverse colon: Secondary | ICD-10-CM | POA: Diagnosis not present

## 2015-10-31 DIAGNOSIS — K6389 Other specified diseases of intestine: Secondary | ICD-10-CM | POA: Diagnosis not present

## 2015-10-31 MED ORDER — SODIUM CHLORIDE 0.9 % IV SOLN: 500.0000 mL | INTRAVENOUS | Status: DC

## 2015-10-31 NOTE — Progress Notes (Signed)
Report to PACU, RN, vss, BBS= Clear.  

## 2015-10-31 NOTE — Progress Notes (Signed)
Called to room to assist during endoscopic procedure.  Patient ID and intended procedure confirmed with present staff. Received instructions for my participation in the procedure from the performing physician.  

## 2015-10-31 NOTE — Op Note (Signed)
[place] Patient Name: Gregory Sanchez Procedure Date: 10/31/2015 1:14 PM MRN: [id number] Endoscopist: Ladene Artist , MD Age: 50 Referring MD:  Date of Birth: 02-10-1966 Gender: Male Account #: [id number] Procedure:                Colonoscopy Indications:              Screening for colorectal malignant neoplasm, This                            is the patient's first colonoscopy Medicines:                Monitored Anesthesia Care Procedure:                Pre-Anesthesia Assessment:                           - Prior to the procedure, a History and Physical                            was performed, and patient medications and                            allergies were reviewed. The patient's tolerance of                            previous anesthesia was also reviewed. The risks                            and benefits of the procedure and the sedation                            options and risks were discussed with the patient.                            All questions were answered, and informed consent                            was obtained. Prior Anticoagulants: The patient has                            taken no previous anticoagulant or antiplatelet                            agents. ASA Grade Assessment: II - A patient with                            mild systemic disease. After reviewing the risks                            and benefits, the patient was deemed in                            satisfactory condition to undergo the procedure.  After obtaining informed consent, the colonoscope                            was passed under direct vision. Throughout the                            procedure, the patient's blood pressure, pulse, and                            oxygen saturations were monitored continuously. The                            Model PCF-H190L [id number]) scope was introduced                            through the anus and  advanced to the the cecum,                            identified by appendiceal orifice and ileocecal                            valve. The ileocecal valve, appendiceal orifice,                            and rectum were photographed. The quality of the                            bowel preparation was adequate. The colonoscopy was                            performed without difficulty. The patient tolerated                            the procedure well. Scope In: 1:31:30 PM Scope Out: 1:49:39 PM Scope Withdrawal Time: 0 hours 16 minutes 10 seconds  Total Procedure Duration: 0 hours 18 minutes 9 seconds  Findings:                 A 8 mm polyp was found in the ileocecal valve. The                            polyp was sessile. The polyp was removed with a hot                            snare. Resection and retrieval were complete.                           Two sessile polyps were found in the transverse                            colon. The polyps were 3 to 5 mm in size. These  polyps were removed with a cold biopsy forceps.                            Resection and retrieval were complete.                           Internal hemorrhoids were found during                            retroflexion. The hemorrhoids were small and Grade                            I (internal hemorrhoids that do not prolapse).                           A 7 mm polyp was found in the transverse colon. The                            polyp was semi-pedunculated. The polyp was removed                            with a cold snare. Resection and retrieval were                            complete.                           A diffuse area of mild melanosis was found in the                            entire colon.                           The exam was otherwise normal throughout the                            examined colon. Complications:            No immediate complications. Estimated blood  loss:                            None. Estimated Blood Loss:     Estimated blood loss: none. Impression:               - One 8 mm polyp at the ileocecal valve, removed                            with a hot snare. Resected and retrieved.                           - Two 3 to 5 mm polyps in the transverse colon,                            removed with a cold biopsy forceps. Resected and  retrieved.                           - Internal hemorrhoids.                           - One 7 mm polyp in the transverse colon, removed                            with a cold snare. Resected and retrieved.                           - Melanosis in the colon. Recommendation:           - Repeat colonoscopy in 5 years for surveillance if                            polyp(s) precancerous, otherwise 10 years.                           - Patient has a contact number available for                            emergencies. The signs and symptoms of potential                            delayed complications were discussed with the                            patient. Return to normal activities tomorrow.                            Written discharge instructions were provided to the                            patient.                           - Resume previous diet.                           - Continue present medications.                           - Await pathology results.                           - No aspirin, ibuprofen, naproxen, or other                            non-steroidal anti-inflammatory drugs for 2 weeks                            after polyp removal. Ladene Artist, MD 10/31/2015 1:54:12 PM This report has been signed electronically.

## 2015-10-31 NOTE — Patient Instructions (Signed)
YOU HAD AN ENDOSCOPIC PROCEDURE TODAY AT [facility]:   Refer to the procedure report that was given to you for any specific questions about what was found during the examination.  If the procedure report does not answer your questions, please call your gastroenterologist to clarify.  If you requested that your care partner not be given the details of your procedure findings, then the procedure report has been included in a sealed envelope for you to review at your convenience later.  YOU SHOULD EXPECT: Some feelings of bloating in the abdomen. Passage of more gas than usual.  Walking can help get rid of the air that was put into your GI tract during the procedure and reduce the bloating. If you had a lower endoscopy (such as a colonoscopy or flexible sigmoidoscopy) you may notice spotting of blood in your stool or on the toilet paper. If you underwent a bowel prep for your procedure, you may not have a normal bowel movement for a few days.  Please Note:  You might notice some irritation and congestion in your nose or some drainage.  This is from the oxygen used during your procedure.  There is no need for concern and it should clear up in a day or so.  SYMPTOMS TO REPORT IMMEDIATELY:   Following lower endoscopy (colonoscopy or flexible sigmoidoscopy):  Excessive amounts of blood in the stool  Significant tenderness or worsening of abdominal pains  Swelling of the abdomen that is new, acute  Fever of 100F or higher   Following upper endoscopy (EGD)  Vomiting of blood or coffee ground material  New chest pain or pain under the shoulder blades  Painful or persistently difficult swallowing  New shortness of breath  Fever of 100F or higher  Black, tarry-looking stools  For urgent or emergent issues, a gastroenterologist can be reached at any hour by calling [phone].   DIET:  We do recommend a small meal at first, but then you may proceed to your regular diet.  Drink  plenty of fluids but you should avoid alcoholic beverages for 24 hours.  ACTIVITY:  You should plan to take it easy for the rest of today and you should NOT DRIVE or use heavy machinery until tomorrow (because of the sedation medicines used during the test).    FOLLOW UP: Our staff will call the number listed on your records the next business day following your procedure to check on you and address any questions or concerns that you may have regarding the information given to you following your procedure. If we do not reach you, we will leave a message.  However, if you are feeling well and you are not experiencing any problems, there is no need to return our call.  We will assume that you have returned to your regular daily activities without incident.  If any biopsies were taken you will be contacted by phone or by letter within the next 1-3 weeks.  Please call us at [phone] if you have not heard about the biopsies in 3 weeks.    SIGNATURES/CONFIDENTIALITY: You and/or your care partner have signed paperwork which will be entered into your electronic medical record.  These signatures attest to the fact that that the information above on your After Visit Summary has been reviewed and is understood.  Full responsibility of the confidentiality of this discharge information lies with you and/or your care-partner.YOU HAD AN ENDOSCOPIC PROCEDURE TODAY AT [facility]:  Refer to the procedure report that was given to you for any specific questions about what was found during the examination.  If the procedure report does not answer your questions, please call your gastroenterologist to clarify.  If you requested that your care partner not be given the details of your procedure findings, then the procedure report has been included in a sealed envelope for you to review at your convenience later.  YOU SHOULD EXPECT: Some feelings of bloating in the abdomen. Passage of more gas than  usual.  Walking can help get rid of the air that was put into your GI tract during the procedure and reduce the bloating. If you had a lower endoscopy (such as a colonoscopy or flexible sigmoidoscopy) you may notice spotting of blood in your stool or on the toilet paper. If you underwent a bowel prep for your procedure, you may not have a normal bowel movement for a few days.  Please Note:  You might notice some irritation and congestion in your nose or some drainage.  This is from the oxygen used during your procedure.  There is no need for concern and it should clear up in a day or so.  SYMPTOMS TO REPORT IMMEDIATELY:   Following lower endoscopy (colonoscopy or flexible sigmoidoscopy):  Excessive amounts of blood in the stool  Significant tenderness or worsening of abdominal pains  Swelling of the abdomen that is new, acute  Fever of 100F or higher   For urgent or emergent issues, a gastroenterologist can be reached at any hour by calling [phone].   DIET:  We do recommend a small meal at first, but then you may proceed to your regular diet.  Drink plenty of fluids but you should avoid alcoholic beverages for 24 hours. Try to increase the fiber in your diet.  ACTIVITY:  You should plan to take it easy for the rest of today and you should NOT DRIVE or use heavy machinery until tomorrow (because of the sedation medicines used during the test).    FOLLOW UP: Our staff will call the number listed on your records the next business day following your procedure to check on you and address any questions or concerns that you may have regarding the information given to you following your procedure. If we do not reach you, we will leave a message.  However, if you are feeling well and you are not experiencing any problems, there is no need to return our call.  We will assume that you have returned to your regular daily activities without incident.  If any biopsies were taken you will be  contacted by phone or by letter within the next 1-3 weeks.  Please call us at [phone] if you have not heard about the biopsies in 3 weeks.    SIGNATURES/CONFIDENTIALITY: You and/or your care partner have signed paperwork which will be entered into your electronic medical record.  These signatures attest to the fact that that the information above on your After Visit Summary has been reviewed and is understood.  Full responsibility of the confidentiality of this discharge information lies with you and/or your care-partner.  Do not take aleve, ibuprofen or aspirin for two weeks to prevent bleeding.  Read all of the handouts given to you by your recovery room nurse.

## 2015-11-01 ENCOUNTER — Telehealth: Payer: Self-pay

## 2015-11-01 NOTE — Telephone Encounter (Signed)
  Follow up Call-  Call back number 10/31/2015  Post procedure Call Back phone  # [phone]  Permission to leave phone message Yes  Some recent data might be hidden     Patient questions:  Do you have a fever, pain , or abdominal swelling? No. Pain Score  0 *  Have you tolerated food without any problems? Yes.    Have you been able to return to your normal activities? Yes.    Do you have any questions about your discharge instructions: Diet   No. Medications  No. Follow up visit  No.  Do you have questions or concerns about your Care? No.  Actions: * If pain score is 4 or above: No action needed, pain <4.

## 2015-11-09 ENCOUNTER — Encounter: Payer: Self-pay | Admitting: Gastroenterology

## 2018-10-23 ENCOUNTER — Other Ambulatory Visit: Payer: Self-pay | Admitting: Internal Medicine

## 2018-10-23 DIAGNOSIS — Z8249 Family history of ischemic heart disease and other diseases of the circulatory system: Secondary | ICD-10-CM

## 2018-11-03 ENCOUNTER — Ambulatory Visit
Admission: RE | Admit: 2018-11-03 | Discharge: 2018-11-03 | Disposition: A | Discharge status: Home / Self Care | Payer: No Typology Code available for payment source | Source: Ambulatory Visit | Attending: Internal Medicine | Admitting: Internal Medicine

## 2018-11-03 ENCOUNTER — Other Ambulatory Visit: Payer: Self-pay

## 2018-11-03 DIAGNOSIS — Z8249 Family history of ischemic heart disease and other diseases of the circulatory system: Secondary | ICD-10-CM

## 2019-02-15 NOTE — Progress Notes (Signed)
02/15/2019 [place] [id number] [birth year]   HISTORY OF PRESENT ILLNESS: Gregory Sanchez is a 53 year old male with a past medical history of hypertension, mild sleep apnea, GERD and colon polyps.  Past right inguinal hernia surgery.  He complains of having epigastric pain for the past week.  He describes his pain as a dull ache which is fairly constant.  He is having mild nausea without vomiting.  He is taking pantoprazole 20 mg once daily for the past year for reflux symptoms as prescribed by his primary care physician.  He denies having any current heartburn.  No dysphagia.  He had diarrhea described as one episode of loose mud-like stool on Tuesday and Thursday last week.  No bloody diarrhea.  He passed a normal formed brown bowel movement on Sunday 1111/29/2020, no further bowel movement since then.  His upper abdominal pain awakened him from sleep on one night last week.  No known history of gallstones.  No fever, sweats or chills.  No weight loss.  He vapes on and off for the past 3 years.  No alcohol or drug use.  He underwent a colonoscopy by Dr. Fuller Plan 10/31/2018, 4 polyps were removed, see results below. No family history of upper GI or colorectal cancer.  Both parents and 4 siblings with hypertension.  One brother had 2 strokes.   Colonoscopy 10/31/2015 by Dr. Fuller Plan:  - One 8 mm polyp at the ileocecal valve, removed with a hot snare.  Biopsies showed benign polypoid mucosa with lymphoid aggregate.  - Two 3 to 5 mm tubular adenomatous polyps in the transverse colon, removed with a cold biopsy forceps. Resected and retrieved. - Internal hemorrhoids. - One 7 mm polyp in the transverse colon, removed with a cold snare. Resected and retrieved. - Melanosis in the colon. -Recall colonoscopy 5 years   Past Medical History:  Diagnosis Date  . Atypical chest pain    stress test normal  . GERD (gastroesophageal reflux disease)    no med, diet controlled  . Hypertension   .  Inguinal hernia    rt  . Sleep apnea    "mild" - Does not have CPAP  . Sleep apnea, obstructive    Mild - Does not have CPAP    Past Surgical History:  Procedure Laterality Date  . HERNIA REPAIR      reports that he quit smoking about 6 years ago. His smoking use included cigarettes. He has a 13.50 pack-year smoking history. He has never used smokeless tobacco. He reports that he does not drink alcohol or use drugs. family history includes Hypertension in his father and mother. Allergies  Allergen Reactions  . Penicillins Hives    Has patient had a PCN reaction causing immediate rash, facial/tongue/throat swelling, SOB or lightheadedness with hypotension: No Has patient had a PCN reaction causing severe rash involving mucus membranes or skin necrosis: No Has patient had a PCN reaction that required hospitalization No Has patient had a PCN reaction occurring within the last 10 years: Yes If all of the above answers are "NO", then may proceed with Cephalosporin use.     Outpatient Encounter Medications as of 02/16/2019  Medication Sig  . amLODipine-benazepril (LOTREL) 5-20 MG per capsule Take 1 capsule by mouth daily.  Marland Kitchen aspirin 81 MG tablet Take 81 mg by mouth daily.  Marland Kitchen ibuprofen (ADVIL,MOTRIN) 600 MG tablet Take 1 tablet (600 mg total) by mouth every 6 (six) hours as needed.  . Multiple  Vitamin (MULTIVITAMIN WITH MINERALS) TABS tablet Take 1 tablet by mouth daily.  Marland Kitchen triamcinolone cream (KENALOG) 0.1 % Apply 1 application topically daily as needed (affected area).    Facility-Administered Encounter Medications as of 02/16/2019  Medication  . 0.9 %  sodium chloride infusion     REVIEW OF SYSTEMS  : All other systems reviewed and negative except where noted in the History of Present Illness.   PHYSICAL EXAM: BP 122/82   Pulse 75   Temp 97.6 F (36.4 C)   Ht 5\' 10"  (1.778 m) Comment: WITHOUT SHOES  Wt 236 lb (107 kg)   BMI 33.86 kg/m  General: Well developed 53 year old  male in no acute distress Head: Normocephalic and atraumatic Eyes:  Sclerae anicteric,conjunctive pink. Ears: Normal auditory acuity Mouth: Dentition intact, no ulcers or lesions  Neck: Supple, no masses.  Lungs: Clear throughout to auscultation Heart: Regular rate and rhythm Abdomen: Soft, epigastric tenderness without rebound or guarding. Non distended. No masses or hepatomegaly noted. Normal bowel sounds x 4 quads. Rectal: Deferred. Musculoskeletal: Symmetrical with no gross deformities  Skin: No lesions on visible extremities Extremities: No edema  Neurological: Alert oriented x 4, grossly nonfocal Cervical Nodes:  No significant cervical adenopathy Psychological:  Alert and cooperative. Normal mood and affect  ASSESSMENT AND PLAN:  1. Epigastric pain with mild nausea -CBC, CMP, CRP, amylase and lipase  -Abdominal sonogram  -EGD benefits and risks discussed including risk with sedation, risk of bleeding, perforation and infection  -Patient declines prescription for ondansetron as his nausea is not significant at this time -Further follow-up to be determined after the above evaluation completed  2.  GERD -Continue pantoprazole 20 mg in the morning.  Add famotidine 20 mg at bedtime. Avoid  spicy and fatty foods -See plan in #1  3.  History of colon polyps -Next colonoscopy due August 2022  4.  Hypertension   CC:  Marton Redwood, MD

## 2019-02-16 ENCOUNTER — Ambulatory Visit: Payer: BC Managed Care – PPO | Admitting: Nurse Practitioner

## 2019-02-16 ENCOUNTER — Other Ambulatory Visit: Payer: Self-pay

## 2019-02-16 ENCOUNTER — Other Ambulatory Visit (INDEPENDENT_AMBULATORY_CARE_PROVIDER_SITE_OTHER): Payer: BC Managed Care – PPO

## 2019-02-16 ENCOUNTER — Encounter: Payer: Self-pay | Admitting: Nurse Practitioner

## 2019-02-16 VITALS — BP 122/82 | HR 75 | Temp 97.6°F | Ht 70.0 in | Wt 236.0 lb

## 2019-02-16 DIAGNOSIS — Z1159 Encounter for screening for other viral diseases: Secondary | ICD-10-CM

## 2019-02-16 DIAGNOSIS — K219 Gastro-esophageal reflux disease without esophagitis: Secondary | ICD-10-CM | POA: Insufficient documentation

## 2019-02-16 DIAGNOSIS — R1013 Epigastric pain: Secondary | ICD-10-CM

## 2019-02-16 DIAGNOSIS — R110 Nausea: Secondary | ICD-10-CM | POA: Diagnosis not present

## 2019-02-16 LAB — CBC WITH DIFFERENTIAL/PLATELET
Basophils Absolute: 0.1 10*3/uL (ref 0.0–0.1)
Basophils Relative: 0.9 % (ref 0.0–3.0)
Eosinophils Absolute: 0.3 10*3/uL (ref 0.0–0.7)
Eosinophils Relative: 6.1 % — ABNORMAL HIGH (ref 0.0–5.0)
HCT: 40.7 % (ref 39.0–52.0)
Hemoglobin: 13.6 g/dL (ref 13.0–17.0)
Lymphocytes Relative: 35.7 % (ref 12.0–46.0)
Lymphs Abs: 2 10*3/uL (ref 0.7–4.0)
MCHC: 33.4 g/dL (ref 30.0–36.0)
MCV: 87.6 fl (ref 78.0–100.0)
Monocytes Absolute: 0.5 10*3/uL (ref 0.1–1.0)
Monocytes Relative: 8.1 % (ref 3.0–12.0)
Neutro Abs: 2.8 10*3/uL (ref 1.4–7.7)
Neutrophils Relative %: 49.2 % (ref 43.0–77.0)
Platelets: 327 10*3/uL (ref 150.0–400.0)
RBC: 4.64 Mil/uL (ref 4.22–5.81)
RDW: 13.8 % (ref 11.5–15.5)
WBC: 5.6 10*3/uL (ref 4.0–10.5)

## 2019-02-16 LAB — C-REACTIVE PROTEIN: CRP: <1 mg/dL (ref 0.5–20.0)

## 2019-02-16 LAB — LIPASE: Lipase: 60 U/L — ABNORMAL HIGH (ref 11.0–59.0)

## 2019-02-16 LAB — AMYLASE: Amylase: 77 U/L (ref 27–131)

## 2019-02-16 MED ORDER — FAMOTIDINE 20 MG PO TABS: 20.0000 mg | ORAL_TABLET | Freq: Every day | ORAL | 12 refills | Status: DC

## 2019-02-16 NOTE — Progress Notes (Signed)
Reviewed and agree with management plan.  Keria Widrig T. Jaxson Anglin, MD FACG [facility]  

## 2019-02-16 NOTE — Patient Instructions (Addendum)
  If you are age 53 or younger, your body mass index should be between 19-25. Your Body mass index is 33.86 kg/m. If this is out of the aformentioned range listed, please consider follow up with your Primary Care Provider.   We have sent the following medications to your pharmacy for you to pick up at your convenience:  Famotadine 20mg  sent to your pharmacy  Continue with Pantoprazole 20mg  in the morning  You have been scheduled for an abdominal ultrasound at [facility] (1st floor of hospital) on 02/22/2019 at 8:00 am. Please arrive 15 minutes prior to your appointment for registration. Make certain not to have anything to eat or drink 6 hours prior to your appointment. Should you need to reschedule your appointment, please contact radiology at [phone]. This test typically takes about 30 minutes to perform.  Your provider has requested that you go to the basement level for lab work before leaving today. Press "B" on the elevator. The lab is located at the first door on the left as you exit the elevator.  Thank you for choosing me and [facility]

## 2019-02-22 ENCOUNTER — Other Ambulatory Visit: Payer: Self-pay

## 2019-02-22 ENCOUNTER — Ambulatory Visit (HOSPITAL_COMMUNITY)
Admission: RE | Admit: 2019-02-22 | Discharge: 2019-02-22 | Disposition: A | Discharge status: Home / Self Care | Payer: BC Managed Care – PPO | Source: Ambulatory Visit | Attending: Nurse Practitioner | Admitting: Nurse Practitioner

## 2019-02-22 DIAGNOSIS — K219 Gastro-esophageal reflux disease without esophagitis: Secondary | ICD-10-CM

## 2019-02-22 DIAGNOSIS — R110 Nausea: Secondary | ICD-10-CM

## 2019-02-22 DIAGNOSIS — R1013 Epigastric pain: Secondary | ICD-10-CM

## 2019-02-25 ENCOUNTER — Other Ambulatory Visit: Payer: Self-pay | Admitting: Gastroenterology

## 2019-02-25 ENCOUNTER — Encounter: Payer: Self-pay | Admitting: Gastroenterology

## 2019-02-25 ENCOUNTER — Ambulatory Visit (INDEPENDENT_AMBULATORY_CARE_PROVIDER_SITE_OTHER): Payer: BC Managed Care – PPO

## 2019-02-25 DIAGNOSIS — Z1159 Encounter for screening for other viral diseases: Secondary | ICD-10-CM

## 2019-02-25 LAB — SARS CORONAVIRUS 2 (TAT 6-24 HRS): SARS Coronavirus 2: NEGATIVE

## 2019-03-01 ENCOUNTER — Ambulatory Visit (AMBULATORY_SURGERY_CENTER): Payer: BC Managed Care – PPO | Admitting: Gastroenterology

## 2019-03-01 ENCOUNTER — Other Ambulatory Visit: Payer: Self-pay

## 2019-03-01 ENCOUNTER — Encounter: Payer: Self-pay | Admitting: Gastroenterology

## 2019-03-01 VITALS — BP 120/84 | HR 61 | Temp 98.1°F | Resp 15 | Ht 70.0 in | Wt 236.0 lb

## 2019-03-01 DIAGNOSIS — R1013 Epigastric pain: Secondary | ICD-10-CM

## 2019-03-01 DIAGNOSIS — K319 Disease of stomach and duodenum, unspecified: Secondary | ICD-10-CM

## 2019-03-01 DIAGNOSIS — K3189 Other diseases of stomach and duodenum: Secondary | ICD-10-CM

## 2019-03-01 DIAGNOSIS — D132 Benign neoplasm of duodenum: Secondary | ICD-10-CM

## 2019-03-01 MED ORDER — PANTOPRAZOLE SODIUM 40 MG PO TBEC: 40.0000 mg | DELAYED_RELEASE_TABLET | Freq: Every day | ORAL | 1 refills | Status: AC

## 2019-03-01 MED ORDER — SODIUM CHLORIDE 0.9 % IV SOLN: 500.0000 mL | Freq: Once | INTRAVENOUS | Status: DC

## 2019-03-01 MED ORDER — DICYCLOMINE HCL 10 MG PO CAPS: 10.0000 mg | ORAL_CAPSULE | Freq: Three times a day (TID) | ORAL | 1 refills | Status: DC

## 2019-03-01 NOTE — Progress Notes (Signed)
Called to room to assist during endoscopic procedure.  Patient ID and intended procedure confirmed with present staff. Received instructions for my participation in the procedure from the performing physician.  

## 2019-03-01 NOTE — Op Note (Signed)
[name] Patient Name: Gregory Sanchez Procedure Date: 03/01/2019 10:40 AM MRN: [id number] Endoscopist: Ladene Artist , MD Age: 53 Referring MD:  Date of Birth: [birth year] Gender: Male Account #: [id number] Procedure:                Upper GI endoscopy Indications:              Epigastric abdominal pain Medicines:                Monitored Anesthesia Care Procedure:                Pre-Anesthesia Assessment:                           - Prior to the procedure, a History and Physical                            was performed, and patient medications and                            allergies were reviewed. The patient's tolerance of                            previous anesthesia was also reviewed. The risks                            and benefits of the procedure and the sedation                            options and risks were discussed with the patient.                            All questions were answered, and informed consent                            was obtained. Prior Anticoagulants: The patient has                            taken no previous anticoagulant or antiplatelet                            agents. ASA Grade Assessment: II - A patient with                            mild systemic disease. After reviewing the risks                            and benefits, the patient was deemed in                            satisfactory condition to undergo the procedure.                           After obtaining informed consent, the endoscope was  passed under direct vision. Throughout the                            procedure, the patient's blood pressure, pulse, and                            oxygen saturations were monitored continuously. The                            Endoscope was introduced through the mouth, and                            advanced to the second part of duodenum. The upper                            GI endoscopy was  accomplished without difficulty.                            The patient tolerated the procedure well. Scope In: Scope Out: Findings:                 The examined esophagus was normal.                           Patchy mildly erythematous mucosa without bleeding                            was found in the gastric body and in the gastric                            antrum. Biopsies were taken with a cold forceps for                            histology.                           The exam of the stomach was otherwise normal.                           A single 3 mm mucosal nodule with a localized                            distribution was found in the second portion of the                            duodenum. Biopsies were taken with a cold forceps,                            completely removed, for histology.                           The exam of the duodenum was otherwise normal. Complications:            No immediate complications. Estimated Blood Loss:     Estimated blood loss was  minimal. Impression:               - Normal esophagus.                           - Erythematous mucosa in the gastric body and                            antrum. Biopsied.                           - Mucosal nodule found in the duodenum. Removed. Recommendation:           - Patient has a contact number available for                            emergencies. The signs and symptoms of potential                            delayed complications were discussed with the                            patient. Return to normal activities tomorrow.                            Written discharge instructions were provided to the                            patient.                           - Resume previous diet.                           - Continue present medications                           - Await pathology results.                           - Return to GI office in 6 weeks.                           - Protonix  (pantoprazole) 40 mg PO daily daily,                            #30, 1 refill.                           - Bentyl (dicyclomine) 10 mg PO TID 30 min AC, #90,                            1 refill. Ladene Artist, MD 03/01/2019 11:01:00 AM This report has been signed electronically.

## 2019-03-01 NOTE — Progress Notes (Signed)
Vs by DT  Temp & covid screen by [facility]

## 2019-03-01 NOTE — Patient Instructions (Signed)
Please, read all of the handouts given to you by your recovery room nurse.  Thank-you for choosing [place] for your healthcare needs today.  YOU HAD AN ENDOSCOPIC PROCEDURE TODAY AT [facility]:   Refer to the procedure report that was given to you for any specific questions about what was found during the examination.  If the procedure report does not answer your questions, please call your gastroenterologist to clarify.  If you requested that your care partner not be given the details of your procedure findings, then the procedure report has been included in a sealed envelope for you to review at your convenience later.  YOU SHOULD EXPECT: Some feelings of bloating in the abdomen. Passage of more gas than usual.  Walking can help get rid of the air that was put into your GI tract during the procedure and reduce the bloating.   Please Note:  You might notice some irritation and congestion in your nose or some drainage.  This is from the oxygen used during your procedure.  There is no need for concern and it should clear up in a day or so.  SYMPTOMS TO REPORT IMMEDIATELY:    Following upper endoscopy (EGD)  Vomiting of blood or coffee ground material  New chest pain or pain under the shoulder blades  Painful or persistently difficult swallowing  New shortness of breath  Fever of 100F or higher  Black, tarry-looking stools  For urgent or emergent issues, a gastroenterologist can be reached at any hour by calling [phone].   DIET:  We do recommend a small meal at first, but then you may proceed to your regular diet.  Drink plenty of fluids but you should avoid alcoholic beverages for 24 hours.  ACTIVITY:  You should plan to take it easy for the rest of today and you should NOT DRIVE or use heavy machinery until tomorrow (because of the sedation medicines used during the test).    FOLLOW UP: Our staff will call the number listed on your records 48-72 hours following your  procedure to check on you and address any questions or concerns that you may have regarding the information given to you following your procedure. If we do not reach you, we will leave a message.  We will attempt to reach you two times.  During this call, we will ask if you have developed any symptoms of COVID 19. If you develop any symptoms (ie: fever, flu-like symptoms, shortness of breath, cough etc.) before then, please call [phone].  If you test positive for Covid 19 in the 2 weeks post procedure, please call and report this information to [place].    If any biopsies were taken you will be contacted by phone or by letter within the next 1-3 weeks.  Please call us at [phone] if you have not heard about the biopsies in 3 weeks.    SIGNATURES/CONFIDENTIALITY: You and/or your care partner have signed paperwork which will be entered into your electronic medical record.  These signatures attest to the fact that that the information above on your After Visit Summary has been reviewed and is understood.  Full responsibility of the confidentiality of this discharge information lies with you and/or your care-partner.

## 2019-03-01 NOTE — Progress Notes (Signed)
A/ox3, pleased with MAC, report to RN 

## 2019-03-03 ENCOUNTER — Telehealth: Payer: Self-pay

## 2019-03-03 NOTE — Telephone Encounter (Signed)
Left message on follow up call. 

## 2019-03-03 NOTE — Telephone Encounter (Signed)
Called [phone] and left a message we tried to reach pt for a follow up call. maw

## 2019-03-04 ENCOUNTER — Encounter: Payer: Self-pay | Admitting: Gastroenterology

## 2019-10-26 ENCOUNTER — Ambulatory Visit: Payer: Self-pay | Attending: Internal Medicine

## 2019-10-26 DIAGNOSIS — Z23 Encounter for immunization: Secondary | ICD-10-CM

## 2019-10-26 NOTE — Progress Notes (Signed)
   Covid-19 Vaccination Clinic  Name:  Gregory Sanchez    MRN: [id number] DOB: 1965/07/23  10/26/2019  [name] was observed post Covid-19 immunization for 15 minutes without incident. He was provided with Vaccine Information Sheet and instruction to access the V-Safe system.   [name] was instructed to call 911 with any severe reactions post vaccine: Marland Kitchen Difficulty breathing  . Swelling of face and throat  . A fast heartbeat  . A bad rash all over body  . Dizziness and weakness   Immunizations Administered    Name Date Dose VIS Date Route   Pfizer COVID-19 Vaccine 10/26/2019  9:00 AM 0.3 mL 05/12/2018 Intramuscular   Manufacturer: [place]   Lot: [id number]   [place]: [id number]

## 2019-11-09 ENCOUNTER — Other Ambulatory Visit: Payer: Self-pay

## 2019-11-09 ENCOUNTER — Ambulatory Visit (INDEPENDENT_AMBULATORY_CARE_PROVIDER_SITE_OTHER): Payer: BC Managed Care – PPO | Admitting: Otolaryngology

## 2019-11-09 ENCOUNTER — Encounter (INDEPENDENT_AMBULATORY_CARE_PROVIDER_SITE_OTHER): Payer: Self-pay | Admitting: Otolaryngology

## 2019-11-09 VITALS — Temp 97.7°F

## 2019-11-09 DIAGNOSIS — H60311 Diffuse otitis externa, right ear: Secondary | ICD-10-CM | POA: Diagnosis not present

## 2019-11-09 NOTE — Progress Notes (Signed)
HPI: Gregory Sanchez is a 54 y.o. male who returns today for evaluation of right ear symptoms.  Who is having some drainage from his ear as well as itching in the ear for the past week although is doing little bit better today.  He used eardrops had given him previously..  Past Medical History:  Diagnosis Date  . Atypical chest pain    stress test normal  . GERD (gastroesophageal reflux disease)    no med, diet controlled  . Hyperlipidemia   . Hypertension   . Inguinal hernia    rt  . Sleep apnea    "mild" - Does not have CPAP  . Sleep apnea, obstructive    Mild - Does not have CPAP    Past Surgical History:  Procedure Laterality Date  . HERNIA REPAIR     Social History   Socioeconomic History  . Marital status: Single    Spouse name: Not on file  . Number of children: Not on file  . Years of education: Not on file  . Highest education level: Not on file  Occupational History  . Not on file  Tobacco Use  . Smoking status: Former Smoker    Packs/day: 0.50    Years: 27.00    Pack years: 13.50    Types: Cigarettes    Quit date: 07/06/2012    Years since quitting: 7.3  . Smokeless tobacco: Never Used  Vaping Use  . Vaping Use: Every day  Substance and Sexual Activity  . Alcohol use: No  . Drug use: No  . Sexual activity: Not on file  Other Topics Concern  . Not on file  Social History Narrative   Denies caffeine use.   Social Determinants of Health   Financial Resource Strain:   . Difficulty of Paying Living Expenses: Not on file  Food Insecurity:   . Worried About Charity fundraiser in the Last Year: Not on file  . Ran Out of Food in the Last Year: Not on file  Transportation Needs:   . Lack of Transportation (Medical): Not on file  . Lack of Transportation (Non-Medical): Not on file  Physical Activity:   . Days of Exercise per Week: Not on file  . Minutes of Exercise per Session: Not on file  Stress:   . Feeling of Stress : Not on file  Social  Connections:   . Frequency of Communication with Friends and Family: Not on file  . Frequency of Social Gatherings with Friends and Family: Not on file  . Attends Religious Services: Not on file  . Active Member of Clubs or Organizations: Not on file  . Attends Archivist Meetings: Not on file  . Marital Status: Not on file   Family History  Problem Relation Age of Onset  . Hypertension Mother   . Hypertension Father   . Colon cancer Neg Hx   . Esophageal cancer Neg Hx   . Rectal cancer Neg Hx   . Stomach cancer Neg Hx    Allergies  Allergen Reactions  . Penicillins Hives    Has patient had a PCN reaction causing immediate rash, facial/tongue/throat swelling, SOB or lightheadedness with hypotension: No Has patient had a PCN reaction causing severe rash involving mucus membranes or skin necrosis: No Has patient had a PCN reaction that required hospitalization No Has patient had a PCN reaction occurring within the last 10 years: Yes If all of the above answers are "NO", then may proceed  with Cephalosporin use.   Prior to Admission medications   Medication Sig Start Date End Date Taking? Authorizing Provider  amLODipine-benazepril (LOTREL) 5-20 MG per capsule Take 1 capsule by mouth daily.   Yes [provider]  dicyclomine (BENTYL) 10 MG capsule Take 1 capsule (10 mg total) by mouth 3 (three) times daily before meals. 03/01/19  Yes Ladene Artist, MD  famotidine (PEPCID) 20 MG tablet Take 1 tablet (20 mg total) by mouth at bedtime. 02/16/19  Yes Noralyn Pick, NP  Multiple Vitamin (MULTIVITAMIN WITH MINERALS) TABS tablet Take 1 tablet by mouth daily.   Yes [provider]  pantoprazole (PROTONIX) 40 MG tablet Take 1 tablet (40 mg total) by mouth daily. 03/01/19  Yes Ladene Artist, MD     Positive ROS: Otherwise negative  All other systems have been reviewed and were otherwise negative with the exception of those mentioned in the HPI and  as above.  Physical Exam: Constitutional: Alert, well-appearing, no acute distress Ears: External ears without lesions or tenderness.  Left ear canal and TM are clear.  Right ear canal is mostly clear although he has some slight moisture down in the ear canal that was cleaned with suction and a curette.  The TM itself was clear with good mobility on pneumatic otoscopy.  After cleaning the right ear canal I applied gentian violet Floxin and CSF powder. Nasal: External nose without lesion. Clear nasal passages Oral: Lips and gums without lesions. Tongue and palate mucosa without lesions. Posterior oropharynx clear. Neck: No palpable adenopathy or masses Respiratory: Breathing comfortably  Skin: No facial/neck lesions or rash noted.  Cerumen impaction removal  Date/Time: 11/09/2019 2:59 PM Performed by: Rozetta Nunnery, MD Authorized by: Rozetta Nunnery, MD   Consent:    Consent obtained:  Verbal   Consent given by:  Patient   Risks discussed:  Pain and bleeding Procedure details:    Location:  R ear   Procedure type: curette and suction   Post-procedure details:    Inspection:  TM intact and canal normal   Hearing quality:  Improved   Patient tolerance of procedure:  Tolerated well, no immediate complications Comments:     Patient with mild inflammatory changes of the right ear canal but this was minimal.  Applied gentian violet, Floxin and CSF powder to the right ear canal.    Assessment: Resolving right external otitis  Plan: Recommend keeping the ear dry for the next 48 hours and will follow up as needed   Radene Journey, MD

## 2019-11-16 ENCOUNTER — Ambulatory Visit: Payer: BC Managed Care – PPO | Attending: Critical Care Medicine

## 2019-11-16 DIAGNOSIS — Z23 Encounter for immunization: Secondary | ICD-10-CM

## 2019-11-16 NOTE — Progress Notes (Signed)
   Covid-19 Vaccination Clinic  Name:  Gregory Sanchez    MRN: [id number] DOB: [birth year]  11/16/2019  [name] was observed post Covid-19 immunization for 15 minutes without incident. He was provided with Vaccine Information Sheet and instruction to access the V-Safe system.   [name] was instructed to call 911 with any severe reactions post vaccine: Marland Kitchen Difficulty breathing  . Swelling of face and throat  . A fast heartbeat  . A bad rash all over body  . Dizziness and weakness   Immunizations Administered    Name Date Dose VIS Date Route   Pfizer COVID-19 Vaccine 11/16/2019  9:48 AM 0.3 mL 05/12/2018 Intramuscular   Manufacturer: [place]   Lot: [id number]: [id number]

## 2019-11-17 ENCOUNTER — Ambulatory Visit (INDEPENDENT_AMBULATORY_CARE_PROVIDER_SITE_OTHER): Payer: BC Managed Care – PPO | Admitting: Otolaryngology

## 2019-11-24 ENCOUNTER — Other Ambulatory Visit: Payer: Self-pay | Admitting: Critical Care Medicine

## 2019-11-24 ENCOUNTER — Other Ambulatory Visit: Payer: BC Managed Care – PPO

## 2019-11-24 DIAGNOSIS — Z20822 Contact with and (suspected) exposure to covid-19: Secondary | ICD-10-CM

## 2019-11-27 LAB — NOVEL CORONAVIRUS, NAA: SARS-CoV-2, NAA: NOT DETECTED

## 2019-12-06 ENCOUNTER — Other Ambulatory Visit: Payer: Self-pay | Admitting: Internal Medicine

## 2019-12-06 DIAGNOSIS — R7401 Elevation of levels of liver transaminase levels: Secondary | ICD-10-CM

## 2019-12-13 ENCOUNTER — Ambulatory Visit
Admission: RE | Admit: 2019-12-13 | Discharge: 2019-12-13 | Disposition: A | Discharge status: Home / Self Care | Payer: BC Managed Care – PPO | Source: Ambulatory Visit | Attending: Internal Medicine | Admitting: Internal Medicine

## 2019-12-13 DIAGNOSIS — R7401 Elevation of levels of liver transaminase levels: Secondary | ICD-10-CM

## 2019-12-14 ENCOUNTER — Encounter: Payer: Self-pay | Admitting: Neurology

## 2019-12-14 ENCOUNTER — Ambulatory Visit: Payer: BC Managed Care – PPO | Admitting: Neurology

## 2019-12-14 VITALS — BP 117/80 | HR 71 | Ht 71.0 in | Wt 220.0 lb

## 2019-12-14 DIAGNOSIS — G4739 Other sleep apnea: Secondary | ICD-10-CM | POA: Diagnosis not present

## 2019-12-14 DIAGNOSIS — G4730 Sleep apnea, unspecified: Secondary | ICD-10-CM

## 2019-12-14 DIAGNOSIS — G4733 Obstructive sleep apnea (adult) (pediatric): Secondary | ICD-10-CM | POA: Diagnosis not present

## 2019-12-14 DIAGNOSIS — G4710 Hypersomnia, unspecified: Secondary | ICD-10-CM | POA: Diagnosis not present

## 2019-12-14 DIAGNOSIS — K219 Gastro-esophageal reflux disease without esophagitis: Secondary | ICD-10-CM | POA: Diagnosis not present

## 2019-12-14 DIAGNOSIS — R0681 Apnea, not elsewhere classified: Secondary | ICD-10-CM

## 2019-12-14 NOTE — Patient Instructions (Signed)

## 2019-12-14 NOTE — Progress Notes (Signed)
SLEEP MEDICINE CLINIC    Provider:  Larey Seat, MD  Primary Care Physician:  Marton Redwood, MD [place] [address]     Referring Provider: Marton Redwood, [address] [place],  [place] [address]          Chief Complaint according to patient   Patient presents with:    . New Patient (Initial Visit)           HISTORY OF PRESENT ILLNESS:  Gregory Sanchez is a 54 y.o. year old African American male patient seen here as a referral on 12/14/2019 from Dr Philip Aspen.   Chief concern according to patient :  More witnessed apneas and getting more tired.    I have the pleasure of seeing Gregory Sanchez today, a right-handed  [place] American male with an untreated  sleep disorder.  He  has a past medical history of Atypical chest pain, GERD (gastroesophageal reflux disease), Hyperlipidemia, Hypertension, Inguinal hernia,  and Sleep apnea, obstructive..   The patient had the first sleep study in the year 2016-the patient was diagnosed with obstructive sleep apnea and returned for a CPAP titration between 5 and 15 cmH2O.  This was on 11/15/2014 for a little bit more than 5 years ago and also his baseline AHI was 15.2 severe sleep apnea the increase in CPAP did not reduce his apneas overall significantly there was a very brief time under 13 cm water pressure where the AHI was 0.0 he only slept 6.8 minutes.  He was then changed to BiPAP and on the BiPAP with a respiratory rate of 12 he tolerated best 19/15 cmH2O.  This still is a very high pressure and many patients will not long-term find comfort and BiPAP settings was high.  He is here today because he never obtained the machine and what I would like to do is to retest him to have a new baseline.  He does suffer from high cholesterol and hypertension and he has been known to snore, have apnea and at this time considers himself sleepier than usual his Epworth Sleepiness Scale was endorsed at 12 out of 24 points while  his fatigue severity scale was only endorsed at 15 points.  I reviewed Dr. Buel Ream laboratory notes to include an elevated ALT at 81, MPV at 5.9 is lower than usual, cholesterol was actually low and seems to be well medication controlled now the total cholesterol level was 88 HDL was 39 LDL to HDL ratio was 4.9 TSH PSA and HbA1c were normal limits.  Dr. 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  He has been vaccinated for COVID-19 with the [place] vaccine the first charge on 10th of August, second shot 11-24-2019.     Family medical /sleep history: No other family member with OSA, insomnia, sleep walkers.    Social history:  Patient is working as a Futures trader, full time, outdoors.  and lives in a household with spouse.  The couple has adult children. Pets are not present. Tobacco use: quit 2014.  ETOH use - none , Caffeine intake in form of Coffee(2 cups ) Soda( /) Tea ( /) or energy drinks.    Sleep habits are as follows: The patient's dinner time is between 6 PM. The patient goes to bed at 9-10 PM  and continues to sleep for several  hours, wakes for physical discomfort, 2 bathroom breaks.The preferred sleep position is supine and laterally, with the support of 1 pillow.  Dreams are reportedly infrequent.  He gets 6 hours of sleep, the bedroom is cool, quiet and dark.  4 AM  is the usual rise time. The patient wakes up with an alarm at 4 AM..  He reports not feeling refreshed or restored in AM, with symptoms such as dry mouth ,but no  morning headaches.  Naps are taken frequently, lasting from 3-4 hours on weekends and are as refreshing as nocturnal sleep.    Review of Systems: Out of a complete 14 system review, the patient complains of only the following symptoms,  and all other reviewed systems are negative.:  Fatigue, sleepiness , loud snoring, fragmented sleep, Insomnia- reduced sleep time at night, discomfort from muscle or joint pain. Arms get numb.   How likely are you to doze in the following situations: 0 = not likely, 1 = slight chance, 2 = moderate chance, 3 = high chance   Sitting and Reading? Watching Television? Sitting inactive in a public place (theater or meeting)? As a passenger in a car for an hour without a break? Lying down in the afternoon when circumstances permit? Sitting and talking to someone? Sitting quietly after lunch without alcohol? In a car, while stopped for a few minutes in traffic?   Total = 12/ 24 points  With naps.   FSS endorsed at 15/ 63 points.   Social History   Socioeconomic History  . Marital status: Single    Spouse name: Not on file  . Number of children: Not on file  . Years of education: Not on file  . Highest education level: Not on file  Occupational History  . Not on file  Tobacco Use  . Smoking status: Former Smoker    Packs/day: 0.50    Years: 27.00    Pack years: 13.50    Types: Cigarettes    Quit date: 07/06/2012    Years since quitting: 7.4  . Smokeless tobacco: Never Used  Vaping Use  . Vaping Use: Every day  Substance and Sexual Activity  . Alcohol use: No  . Drug use: No  . Sexual activity: Not on file  Other Topics Concern  . Not on file  Social History Narrative   Denies caffeine use.   Social Determinants of Health   Financial Resource Strain:   . Difficulty of Paying Living Expenses: Not on file  Food Insecurity:   . Worried About Charity fundraiser in the Last Year: Not on file  . Ran Out of Food in the Last Year: Not on file  Transportation Needs:   . Lack of Transportation (Medical): Not on file  . Lack of Transportation (Non-Medical): Not on file  Physical Activity:   . Days of Exercise per Week: Not on file  . Minutes of Exercise per Session: Not on  file  Stress:   . Feeling of Stress : Not on file  Social Connections:   . Frequency of Communication with Friends and Family: Not on file  . Frequency of Social Gatherings with Friends and Family: Not on file  . Attends Religious Services: Not on file  . Active Member of Clubs or Organizations: Not on file  . Attends Archivist Meetings: Not on file  . Marital Status: Not on file    Family History  Problem Relation Age of  Onset  . Hypertension Mother   . Hypertension Father   . Colon cancer Neg Hx   . Esophageal cancer Neg Hx   . Rectal cancer Neg Hx   . Stomach cancer Neg Hx     Past Medical History:  Diagnosis Date  . Atypical chest pain    stress test normal  . GERD (gastroesophageal reflux disease)    no med, diet controlled  . Hyperlipidemia   . Hypertension   . Inguinal hernia    rt  . Sleep apnea    "mild" - Does not have CPAP  . Sleep apnea, obstructive    Mild - Does not have CPAP     Past Surgical History:  Procedure Laterality Date  . HERNIA REPAIR       Current Outpatient Medications on File Prior to Visit  Medication Sig Dispense Refill  . amLODipine-benazepril (LOTREL) 5-20 MG per capsule Take 1 capsule by mouth daily.    . ergocalciferol (VITAMIN D2) 1.25 MG (50000 UT) capsule Take 50,000 Units by mouth once a week.    . famotidine (PEPCID) 40 MG tablet Take 40 mg by mouth daily.    . Multiple Vitamin (MULTIVITAMIN WITH MINERALS) TABS tablet Take 1 tablet by mouth daily.    . pantoprazole (PROTONIX) 40 MG tablet Take 1 tablet (40 mg total) by mouth daily. 30 tablet 1  . rosuvastatin (CRESTOR) 20 MG tablet Take 20 mg by mouth daily.     No current facility-administered medications on file prior to visit.    Allergies  Allergen Reactions  . Penicillins Hives    Has patient had a PCN reaction causing immediate rash, facial/tongue/throat swelling, SOB or lightheadedness with hypotension: No Has patient had a PCN reaction causing severe  rash involving mucus membranes or skin necrosis: No Has patient had a PCN reaction that required hospitalization No Has patient had a PCN reaction occurring within the last 10 years: Yes If all of the above answers are "NO", then may proceed with Cephalosporin use.    Physical exam:  Today's Vitals   12/14/19 0953  BP: 117/80  Pulse: 71  Weight: 220 lb (99.8 kg)  Height: 5\' 11"  (1.803 m)   Body mass index is 30.68 kg/m.   Wt Readings from Last 3 Encounters:  12/14/19 220 lb (99.8 kg)  03/01/19 236 lb (107 kg)  02/16/19 236 lb (107 kg)     Ht Readings from Last 3 Encounters:  12/14/19 5\' 11"  (1.803 m)  03/01/19 5\' 10"  (1.778 m)  02/16/19 5\' 10"  (1.778 m)      General: The patient is awake, alert and appears not in acute distress. The patient is well groomed. Head: Normocephalic, atraumatic. Neck is supple. Mallampati 3,  neck circumference:16.5 inches . Nasal airflow patent.  Retrognathia is  seen.  Dental status: n/a  Cardiovascular:  Regular rate and cardiac rhythm by pulse,  without distended neck veins. Respiratory: Lungs are clear to auscultation.  Skin:  Without evidence of ankle edema, or rash. Trunk: The patient's posture is erect.   Neurologic exam : The patient is awake and alert, oriented to place and time.   Memory subjective described as intact.  Attention span & concentration ability appears normal.  Speech is fluent,  without  dysarthria, dysphonia or aphasia.  Mood and affect are appropriate.   Cranial nerves: no loss of smell or taste reported  Pupils are equal and briskly reactive to light. Funduscopic exam deferred.   Extraocular movements in vertical  and horizontal planes were intact and without nystagmus. No Diplopia. Visual fields by finger perimetry are intact. Hearing was intact to soft voice and finger rubbing.    Facial sensation intact to fine touch.  Facial motor strength is symmetric and tongue and uvula move midline.  Neck ROM :  rotation, tilt and flexion extension were normal for age and shoulder shrug was symmetrical.    Motor exam:  Symmetric bulk, tone and ROM.   Normal tone without cog wheeling, symmetric grip strength .   Sensory:  Fine touch, pinprick and vibration were tested  and  normal.  Proprioception tested in the upper extremities was normal.   Coordination: Rapid alternating movements in the fingers/hands were of normal speed.  The Finger-to-nose maneuver was intact without evidence of ataxia, dysmetria or tremor.   Gait and station: Patient could rise unassisted from a seated position, walked without assistive device.  Stance is of normal width/ base .  Toe and heel walk were deferred.  Deep tendon reflexes: in the  upper and lower extremities are symmetric and intact.  Babinski response was deferred.        After spending a total time of  35 minutes face to face and additional time for physical and neurologic examination, review of laboratory studies,  personal review of imaging studies, reports and results of other testing and review of referral information / records as far as provided in visit, I have established the following assessments:  1) patient still at high risk of OSA and not CSA- we need a new n baseline study and can follow with a PAP titration.  His current insurance will permit a HST or PSG, not SPLIT.    I would like to thank Marton Redwood, MD and Marton Redwood, [place] [place],  [place] [address] for allowing me to meet with and to take care of this pleasant patient.   In short, CALIB WADHWA is presenting with untreated OSA. I plan to follow up either personally or through our NP within 3 month.   CC: I will share my notes with PCP.   Electronically signed by: Larey Seat, MD 12/14/2019 10:18 AM  [facility] Board certified by The [employer] of Sleep Medicine and Diplomate of the [employer] of Sleep  Medicine. Board certified In Neurology through the [employer], Fellow of the [employer] of Neurology. Medical Director of [facility].

## 2019-12-26 ENCOUNTER — Ambulatory Visit (INDEPENDENT_AMBULATORY_CARE_PROVIDER_SITE_OTHER): Payer: BC Managed Care – PPO | Admitting: Neurology

## 2019-12-26 DIAGNOSIS — G4739 Other sleep apnea: Secondary | ICD-10-CM

## 2019-12-26 DIAGNOSIS — G4733 Obstructive sleep apnea (adult) (pediatric): Secondary | ICD-10-CM

## 2019-12-26 DIAGNOSIS — G4710 Hypersomnia, unspecified: Secondary | ICD-10-CM

## 2019-12-26 DIAGNOSIS — K219 Gastro-esophageal reflux disease without esophagitis: Secondary | ICD-10-CM

## 2020-01-06 ENCOUNTER — Telehealth: Payer: Self-pay | Admitting: Neurology

## 2020-01-06 DIAGNOSIS — K219 Gastro-esophageal reflux disease without esophagitis: Secondary | ICD-10-CM | POA: Insufficient documentation

## 2020-01-06 DIAGNOSIS — G4739 Other sleep apnea: Secondary | ICD-10-CM | POA: Insufficient documentation

## 2020-01-06 NOTE — Telephone Encounter (Signed)
I called pt. I advised pt that Dr. Brett Fairy reviewed their sleep study results and found that has complex sleep apnea and recommends that pt be treated with a cpap. Dr. Roddie Mc recommends that pt return for a repeat sleep study in order to properly titrate the cpap and ensure a good mask fit. Pt is agreeable to returning for a titration study. I advised pt that our sleep lab will file with pt's insurance and call pt to schedule the sleep study when we hear back from the pt's insurance regarding coverage of this sleep study. Pt verbalized understanding of results. Pt had no questions at this time but was encouraged to call back if questions arise.

## 2020-01-06 NOTE — Progress Notes (Signed)
1. Complex, and mostly Obstructive Sleep Apnea (OSA), AHI was  9.3/h and supine dependent.  2. No Periodic Limb Movement Disorder (PLMD)  3. Snoring was noted    RECOMMENDATIONS: 1) Avoiding the supine sleep position.   1. Advise either autotitration or full-night, attended, PAP  titration study to optimize therapy. The patient can also  alternatively consider a therapy by dental device, given he has  minimal oxygen desaturation and no REM sleep dependency.

## 2020-01-06 NOTE — Procedures (Signed)
PATIENT'S NAME:  Gregory Sanchez, Gregory Sanchez DOB:      [birth year]      MR#:    [id number]     DATE OF RECORDING: 12/26/2019 [facility] REFERRING M.D.:  Carmie Kanner, MD Study Performed:   Baseline Polysomnogram HISTORY:  Seen here as a referral on 12/14/2019 from Dr Philip Aspen.   Chief concern according to patient :  Having more witnessed apneas and getting more tired.    I have the pleasure of seeing Gregory Sanchez today, a right-handed [place] American male with an untreated sleep disorder.  He has a medical history of Atypical chest pain, GERD (gastroesophageal reflux disease), Hyperlipidemia, Hypertension, Inguinal hernia, and Sleep apnea, obstructive.   The patient had the first sleep study in the year 2016-the patient was diagnosed with obstructive sleep apnea and returned for a CPAP titration between 5 and 15 cmH2O on 11/15/2014. CPAP did not reduce his apneas overall significantly, there was a very brief time under 13 cm water pressure where the AHI was 0.0 he only slept 6.8 minutes.  He was then changed to BiPAP and on the BiPAP with a respiratory rate of ST12 he tolerated best at 19/15 cmH2O.  This still is a very high pressure and many patients will not long-term find comfort in BiPAP settings this high.   He is here today because he never obtained the machine and what I would like to do is to retest him to have a new baseline.  He does suffer from high cholesterol and hypertension and he has been known to snore, have apnea and at this time considers himself sleepier than usual his Epworth Sleepiness Scale was endorsed at 12 out of 24 points  The patient endorsed the Epworth Sleepiness Scale at 12/24 points.   The patient's weight 220 pounds with a height of 71 (inches), resulting in a BMI of 30.9 kg/m2. The patient's neck circumference measured 16.5 inches.  CURRENT MEDICATIONS: Lotrel, Vitamin D2, Pepcid, Multivitamin, Protonix, Crestor.   PROCEDURE:  This is a multichannel digital  polysomnogram utilizing the Somnostar 11.2 system.  Electrodes and sensors were applied and monitored per AASM Specifications.   EEG, EOG, Chin and Limb EMG, were sampled at 200 Hz.  ECG, Snore and Nasal Pressure, Thermal Airflow, Respiratory Effort, CPAP Flow and Pressure, Oximetry was sampled at 50 Hz. Digital video and audio were recorded.      BASELINE STUDY: Lights Out was at 21:13 and Lights On at 04:57.  Total recording time (TRT) was 464.5 minutes, with a total sleep time (TST) of 438.5 minutes.   The patient's sleep latency was 10.5 minutes.  REM latency was 37 minutes.  The sleep efficiency was 94.4 %.     SLEEP ARCHITECTURE: WASO (Wake after sleep onset) was 23.5 minutes.  There were 9.5 minutes in Stage N1, 286.5 minutes Stage N2, 38 minutes Stage N3 and 104.5 minutes in Stage REM.  The percentage of Stage N1 was 2.2%, Stage N2 was 65.3%, Stage N3 was 8.7% and Stage R (REM sleep) was 23.8%.     RESPIRATORY ANALYSIS:  There were a total of 68 respiratory events:  12 obstructive apneas, 6 central apneas and 0 mixed apneas with a total of 18 apneas and an apnea index (AI) of 2.5 /hour. There were 50 hypopneas with a hypopnea index of 6.8 /hour.     The total APNEA/HYPOPNEA INDEX (AHI) was 9.3/hour.  17 events occurred in REM sleep and 72 events in NREM. The REM AHI was  9.8 /hour, versus a non-REM AHI of 9.2/h. The patient spent 88 minutes of total sleep time in the supine position and 351 minutes in non-supine. The supine AHI was 21.8/h versus a non-supine AHI of 6.2/h.  OXYGEN SATURATION & C02:  The Wake baseline 02 saturation was 95%, with the lowest being 86%. Time spent below 89% saturation equaled 7 minutes. The arousals were noted as: 74 were spontaneous, 0 were associated with PLMs, 15 were associated with respiratory events. The patient had a total of 0 Periodic Limb Movements.    Audio and video analysis did not show any abnormal or unusual movements, behaviors, phonations or  vocalizations. No nocturia.   Snoring was noted. EKG was in keeping with normal sinus rhythm (NSR).   IMPRESSION:  1. Complex, and mostly Obstructive Sleep Apnea (OSA), AHI was 9.3/h and supine dependent.  2. No Periodic Limb Movement Disorder (PLMD) 3. Snoring was noted    RECOMMENDATIONS: 1) Avoiding the supine sleep position.   1. Advise either autotitration or full-night, attended, PAP titration study to optimize therapy. The patient can also alternatively consider a therapy by dental device, given he has minimal oxygen desaturation and no REM sleep dependency.     I certify that I have reviewed the entire raw data recording prior to the issuance of this report in accordance with the Standards of Accreditation of the American Academy of Sleep Medicine (AASM)    Larey Seat, MD Diplomat, American Board of Psychiatry and Neurology  Diplomat, American Board of Sleep Medicine Market researcher, [facility] [employer]

## 2020-01-06 NOTE — Telephone Encounter (Signed)
-----   Message from Larey Seat, MD sent at 01/06/2020  9:39 AM EDT ----- 1. Complex, and mostly Obstructive Sleep Apnea (OSA), AHI was  9.3/h and supine dependent.  2. No Periodic Limb Movement Disorder (PLMD)  3. Snoring was noted    RECOMMENDATIONS: 1) Avoiding the supine sleep position.   1. Advise either autotitration or full-night, attended, PAP  titration study to optimize therapy. The patient can also  alternatively consider a therapy by dental device, given he has  minimal oxygen desaturation and no REM sleep dependency.

## 2020-04-03 ENCOUNTER — Other Ambulatory Visit: Payer: Self-pay

## 2020-04-03 ENCOUNTER — Ambulatory Visit (INDEPENDENT_AMBULATORY_CARE_PROVIDER_SITE_OTHER): Payer: BC Managed Care – PPO | Admitting: Otolaryngology

## 2020-04-03 VITALS — Temp 97.5°F

## 2020-04-03 DIAGNOSIS — H60311 Diffuse otitis externa, right ear: Secondary | ICD-10-CM

## 2020-04-03 NOTE — Progress Notes (Signed)
HPI: Gregory Sanchez is a 55 y.o. male who returns today for evaluation of ear complaints.  He has been having some itching in the right ear which has been worse recently.  I had previously seen him 6 months ago and treated him for external otitis.  Apparently I gave him a prescription for eardrops to use but he does not have the bottle as he threw this away.  He is having no drainage from the ears and minimal pain or discomfort.  Mostly just itching..  Past Medical History:  Diagnosis Date  . Atypical chest pain    stress test normal  . GERD (gastroesophageal reflux disease)    no med, diet controlled  . Hyperlipidemia   . Hypertension   . Inguinal hernia    rt  . Sleep apnea    "mild" - Does not have CPAP  . Sleep apnea, obstructive    Mild - Does not have CPAP    Past Surgical History:  Procedure Laterality Date  . HERNIA REPAIR     Social History   Socioeconomic History  . Marital status: Single    Spouse name: Not on file  . Number of children: Not on file  . Years of education: Not on file  . Highest education level: Not on file  Occupational History  . Not on file  Tobacco Use  . Smoking status: Former Smoker    Packs/day: 0.50    Years: 27.00    Pack years: 13.50    Types: Cigarettes    Quit date: 07/06/2012    Years since quitting: 7.7  . Smokeless tobacco: Never Used  Vaping Use  . Vaping Use: Every day  Substance and Sexual Activity  . Alcohol use: No  . Drug use: No  . Sexual activity: Not on file  Other Topics Concern  . Not on file  Social History Narrative   Denies caffeine use.   Social Determinants of Health   Financial Resource Strain: Not on file  Food Insecurity: Not on file  Transportation Needs: Not on file  Physical Activity: Not on file  Stress: Not on file  Social Connections: Not on file   Family History  Problem Relation Age of Onset  . Hypertension Mother   . Hypertension Father   . Colon cancer Neg Hx   . Esophageal  cancer Neg Hx   . Rectal cancer Neg Hx   . Stomach cancer Neg Hx    Allergies  Allergen Reactions  . Penicillins Hives    Has patient had a PCN reaction causing immediate rash, facial/tongue/throat swelling, SOB or lightheadedness with hypotension: No Has patient had a PCN reaction causing severe rash involving mucus membranes or skin necrosis: No Has patient had a PCN reaction that required hospitalization No Has patient had a PCN reaction occurring within the last 10 years: Yes If all of the above answers are "NO", then may proceed with Cephalosporin use.   Prior to Admission medications   Medication Sig Start Date End Date Taking? Authorizing Provider  amLODipine-benazepril (LOTREL) 5-20 MG per capsule Take 1 capsule by mouth daily.    [provider]  ergocalciferol (VITAMIN D2) 1.25 MG (50000 UT) capsule Take 50,000 Units by mouth once a week.    [provider]  famotidine (PEPCID) 40 MG tablet Take 40 mg by mouth daily. 11/19/19   [provider]  Multiple Vitamin (MULTIVITAMIN WITH MINERALS) TABS tablet Take 1 tablet by mouth daily.    [provider]  pantoprazole (PROTONIX) 40 MG tablet Take 1 tablet (40 mg total) by mouth daily. 03/01/19   Ladene Artist, MD  rosuvastatin (CRESTOR) 20 MG tablet Take 20 mg by mouth daily. 12/07/19   [provider]     Positive ROS: Otherwise negative  All other systems have been reviewed and were otherwise negative with the exception of those mentioned in the HPI and as above.  Physical Exam: Constitutional: Alert, well-appearing, no acute distress Ears: External ears without lesions or tenderness.  Ear canals are clear bilaterally.  He has slight moisture in the right ear canal but no swelling.  After cleaning the ear canal I applied gentian violet and CSF powder to the right ear canal.  TMs are clear bilaterally. Nasal: External nose without lesions. . Clear nasal passages Oral: Lips and gums  without lesions. Tongue and palate mucosa without lesions. Posterior oropharynx clear. Neck: No palpable adenopathy or masses Respiratory: Breathing comfortably  Skin: No facial/neck lesions or rash noted.  Procedures  Assessment: Mild external otitis with secondary itching.  Plan: Applied gentian violet and CSF powder to the right ear in the office today.  I discussed with him concerning use of alcohol and vinegar ear rinses in the future if he has any further itching. Also gave him a prescription for Cortisporin otic suspension drops to use 4 to 5 drops twice daily for 5 days if the alcohol vinegar rinse does not help. He will follow-up as needed.   Radene Journey, MD

## 2020-06-22 ENCOUNTER — Other Ambulatory Visit: Payer: Self-pay | Admitting: Neurology

## 2020-06-22 ENCOUNTER — Telehealth: Payer: Self-pay

## 2020-06-22 DIAGNOSIS — G4733 Obstructive sleep apnea (adult) (pediatric): Secondary | ICD-10-CM

## 2020-06-22 DIAGNOSIS — K219 Gastro-esophageal reflux disease without esophagitis: Secondary | ICD-10-CM

## 2020-06-22 DIAGNOSIS — R0681 Apnea, not elsewhere classified: Secondary | ICD-10-CM

## 2020-06-22 DIAGNOSIS — G4739 Other sleep apnea: Secondary | ICD-10-CM

## 2020-06-22 DIAGNOSIS — G4710 Hypersomnia, unspecified: Secondary | ICD-10-CM

## 2020-06-22 NOTE — Telephone Encounter (Signed)
Pt is calling about CPAP. Pt had a NPSG in October 2021. Looks like a CPAP study was not ordered, even though MD has recommended pt to come for a CPAP study. Do you want to order a CPAP study or order pt an autoPAP? Please advise.

## 2020-06-22 NOTE — Telephone Encounter (Signed)
Due to previous SS completed in 2016 indicating the need for BiPAP, it is best to proceed with the CPAP titration. CPAP titration is ordered for the patient

## 2020-06-29 ENCOUNTER — Telehealth: Payer: Self-pay

## 2020-06-29 NOTE — Telephone Encounter (Signed)
LVM for pt to call me back to schedule sleep study  

## 2020-07-04 ENCOUNTER — Telehealth: Payer: Self-pay

## 2020-07-04 NOTE — Telephone Encounter (Signed)
Calling to schedule patient for a CPAP titration sleep study

## 2020-08-03 ENCOUNTER — Other Ambulatory Visit: Payer: Self-pay

## 2020-08-03 ENCOUNTER — Ambulatory Visit (INDEPENDENT_AMBULATORY_CARE_PROVIDER_SITE_OTHER): Payer: BC Managed Care – PPO | Admitting: Neurology

## 2020-08-03 DIAGNOSIS — K219 Gastro-esophageal reflux disease without esophagitis: Secondary | ICD-10-CM

## 2020-08-03 DIAGNOSIS — G4739 Other sleep apnea: Secondary | ICD-10-CM

## 2020-08-03 DIAGNOSIS — G4710 Hypersomnia, unspecified: Secondary | ICD-10-CM

## 2020-08-03 DIAGNOSIS — G4733 Obstructive sleep apnea (adult) (pediatric): Secondary | ICD-10-CM

## 2020-08-16 NOTE — Procedures (Signed)
PATIENT'S NAME:  Gregory Sanchez, Gregory Sanchez DOB:      1965-11-22      MR#:    [id number]     DATE OF RECORDING: 08/03/2020 Richard Miu REFERRING M.D.:  Marton Redwood, MD Study Performed:   Titration to positive airway pressure  HISTORY:  Patient returned after a baseline PSG by Donnamae Jude.A. from 12/25/2020 revealed an AHI of 9.3/hour, the supine AHI was 21.8/h and there was no prolonged hypoxemia.  Gregory Sanchez is a right-handed [place] American male with an untreated sleep disorder.  He has a medical history of Atypical chest pain, GERD (gastroesophageal reflux disease), Hyperlipidemia, Hypertension, Inguinal hernia, and Sleep apnea, obstructive. He does suffer from high cholesterol and hypertension and he has been known to snore, have apnea and at this time considers himself sleepier than usual. A previous sleep study had indicated treatment emergent central apnea   The patient endorsed the Epworth Sleepiness Scale at 12/24 points.   The patient's weight 220 pounds with a height of 71 (inches), resulting in a BMI of 30.9 kg/m2. The patient's neck circumference measured 16.5 inches.  The patient endorsed the Epworth Sleepiness Scale at 12 points.   The patient's weight 220 pounds with a height of 71 (inches), resulting in a BMI of 30.9 kg/m2. The patient's neck circumference measured 16.5 inches.  CURRENT MEDICATIONS: Lotrel, Vitamin D, Pepcid, Multivitamin, Protonix, Crestor   PROCEDURE:  This is a multichannel digital polysomnogram utilizing the SomnoStar 11.2 system.  Electrodes and sensors were applied and monitored per AASM Specifications.   EEG, EOG, Chin and Limb EMG, were sampled at 200 Hz.  ECG, Snore and Nasal Pressure, Thermal Airflow, Respiratory Effort, CPAP Flow and Pressure, Oximetry was sampled at 50 Hz. Digital video and audio were recorded.      CPAP was initiated at 5 cmH20 with a [employer] Simplus FFM- heated humidity per AASM standards and pressure was advanced to16  cmH20 because of hypopneas, apneas and desaturations. There was a brief excursion into BiPAP at 18/14 cm water and finally 19/15 cm without benefit for the AHI. At a PAP pressure of 15 cmH20, there was a reduction of the AHI to 0.8 with improvement of sleep apnea and REM sleep was present over a 71 minute sleep period. Lights Out was at 20:31 and Lights On at 05:04. Total recording time (TRT) was 514 minutes, with a total sleep time (TST) of 474.5 minutes. The patient's sleep latency was 9 minutes. REM latency was 69.5 minutes.  The sleep efficiency was 92.3 %.    SLEEP ARCHITECTURE: WASO (Wake after sleep onset) was 33.5 minutes. There were 106 minutes in Stage N1, 270.5 minutes Stage N2, 8 minutes Stage N3 and 90 minutes in Stage REM.  The percentage of Stage N1 was 22.3%, Stage N2 was 57.%, Stage N3 was 1.7% and Stage R (REM sleep) was 19.%.   RESPIRATORY ANALYSIS:  There was a total of 68 respiratory events: 17 obstructive apneas, 22 central apneas and 0 mixed apneas with a total of 39 apneas and an apnea index (AI) of 4.9 /hour. There were 29 hypopneas with a hypopnea index of 3.7/hour.   The total APNEA/HYPOPNEA INDEX (AHI) was 8.6 /hour.  1 event occurred in REM sleep and 67 events in NREM. The REM AHI was 0.7 /hour versus a non-REM AHI of 10.5 /hour. The patient spent 377 minutes of total sleep time in the supine position and 98 minutes in non-supine. The supine AHI was 10.7, versus a  non-supine AHI of 0.6.  OXYGEN SATURATION & C02:  The baseline 02 saturation was 98%, with the lowest being 90%. Time spent below 89% saturation equaled 0 minutes. The arousals were noted as: 77 were spontaneous, 0 were associated with PLMs, 22 were associated with respiratory events. The patient had a total of 0 Periodic Limb Movements.  Snoring was noted in the first 3 hours of titration. EKG was in keeping with normal sinus rhythm (NSR).   DIAGNOSIS: Obstructive Sleep Apnea was best controlled under CPAP at  15 cm water- The patient was fitted with a Simplus medium size FFM mask. There was no Sleep Related Hypoxemia.  PLANS/RECOMMENDATIONS: auto CPAP at 6-16 cm water pressure with 1 cm EPR. 1. Any Apnea patient should avoid sedatives, hypnotics, and alcohol consumption before bedtime. 2. CPAP therapy compliance is defined as 4 hours or more of nightly use.   DISCUSSION: A follow up appointment will be scheduled in the Sleep Clinic at [facility].   Please call [phone] with any questions.      I certify that I have reviewed the entire raw data recording prior to the issuance of this report in accordance with the Standards of Accreditation of the American Academy of Sleep Medicine (AASM)   Larey Seat, M.D. Diplomat, Tax adviser of Psychiatry and Neurology  Diplomat, Tax adviser of Sleep Medicine Market researcher, [facility] [employer]

## 2020-08-16 NOTE — Addendum Note (Signed)
Addended by: Larey Seat on: 08/16/2020 05:21 PM   Modules accepted: Orders

## 2020-08-17 ENCOUNTER — Telehealth: Payer: Self-pay | Admitting: Neurology

## 2020-08-17 NOTE — Telephone Encounter (Signed)
I called pt. I advised pt that Dr. Brett Fairy  reviewed their sleep study results and found that pt has sleep apnea best teat. Dr. Brett Fairy recommends that pt starts CPAP at a pressure 15 cm water pressure. I reviewed PAP compliance expectations with the pt. Pt is agreeable to starting a CPAP. I advised pt that an order will be sent to a DME, [facility] ([place] , and [facility]  will call the pt within about one week after they file with the pt's insurance. [facility]) will show the pt how to use the machine, fit for masks, and troubleshoot the CPAP if needed. A follow up appt was made for insurance purposes with Debbora Presto, NP on Sept 27,2022 at 3:30 pm. Pt verbalized understanding to arrive 15 minutes early and bring their CPAP. A letter with all of this information in it will be mailed to the pt as a reminder. I verified with the pt that the address we have on file is correct. Pt verbalized understanding of results. Pt had no questions at this time but was encouraged to call back if questions arise. I have sent the order to [facility]) and have received confirmation that they have received the order.

## 2020-08-17 NOTE — Telephone Encounter (Signed)
-----   Message from Larey Seat, MD sent at 08/16/2020  5:20 PM EDT ----- DIAGNOSIS: Obstructive Sleep Apnea was best controlled under CPAP at 15 cm water- The patient was fitted with a Simplus medium size FFM mask. There was no Sleep Related Hypoxemia.  PLANS/RECOMMENDATIONS: auto CPAP at 6-16 cm water pressure with 1 cm EPR. 1. Any Apnea patient should avoid sedatives, hypnotics, and alcohol consumption before bedtime. 2. CPAP therapy compliance is defined as 4 hours or more of nightly use.   DISCUSSION: A follow up appointment will be scheduled with our NPs in the Sleep Clinic at [facility].   Please call [phone] with any questions.

## 2020-10-19 ENCOUNTER — Other Ambulatory Visit: Payer: Self-pay | Admitting: Internal Medicine

## 2020-10-19 ENCOUNTER — Other Ambulatory Visit: Payer: Self-pay

## 2020-10-19 ENCOUNTER — Ambulatory Visit
Admission: RE | Admit: 2020-10-19 | Discharge: 2020-10-19 | Disposition: A | Discharge status: Home / Self Care | Payer: BC Managed Care – PPO | Source: Ambulatory Visit | Attending: Internal Medicine | Admitting: Internal Medicine

## 2020-10-19 ENCOUNTER — Telehealth: Payer: Self-pay | Admitting: Neurology

## 2020-10-19 DIAGNOSIS — R0600 Dyspnea, unspecified: Secondary | ICD-10-CM

## 2020-10-19 DIAGNOSIS — R911 Solitary pulmonary nodule: Secondary | ICD-10-CM

## 2020-10-19 DIAGNOSIS — R058 Other specified cough: Secondary | ICD-10-CM

## 2020-10-27 ENCOUNTER — Encounter: Payer: Self-pay | Admitting: Gastroenterology

## 2020-10-30 ENCOUNTER — Other Ambulatory Visit: Payer: Self-pay

## 2020-10-30 ENCOUNTER — Ambulatory Visit: Payer: BC Managed Care – PPO | Admitting: Cardiology

## 2020-10-30 ENCOUNTER — Other Ambulatory Visit: Payer: Self-pay | Admitting: Cardiology

## 2020-10-30 ENCOUNTER — Encounter: Payer: Self-pay | Admitting: Cardiology

## 2020-10-30 VITALS — BP 123/71 | HR 80 | Temp 98.7°F | Resp 16 | Ht 71.0 in | Wt 212.0 lb

## 2020-10-30 DIAGNOSIS — Z87891 Personal history of nicotine dependence: Secondary | ICD-10-CM

## 2020-10-30 DIAGNOSIS — I2510 Atherosclerotic heart disease of native coronary artery without angina pectoris: Secondary | ICD-10-CM

## 2020-10-30 DIAGNOSIS — G4733 Obstructive sleep apnea (adult) (pediatric): Secondary | ICD-10-CM

## 2020-10-30 DIAGNOSIS — R0600 Dyspnea, unspecified: Secondary | ICD-10-CM

## 2020-10-30 DIAGNOSIS — R0609 Other forms of dyspnea: Secondary | ICD-10-CM

## 2020-10-30 DIAGNOSIS — I700 Atherosclerosis of aorta: Secondary | ICD-10-CM

## 2020-10-30 DIAGNOSIS — R072 Precordial pain: Secondary | ICD-10-CM

## 2020-10-30 DIAGNOSIS — I10 Essential (primary) hypertension: Secondary | ICD-10-CM

## 2020-10-30 MED ORDER — ASPIRIN EC 81 MG PO TBEC
81.0000 mg | DELAYED_RELEASE_TABLET | Freq: Every day | ORAL | 11 refills | Status: DC
Start: 2020-10-30 — End: 2021-11-16

## 2020-10-30 MED ORDER — METOPROLOL TARTRATE 25 MG PO TABS: 25.0000 mg | ORAL_TABLET | Freq: Two times a day (BID) | ORAL | 0 refills | Status: DC

## 2020-10-30 NOTE — Progress Notes (Signed)
Date:  10/30/2020   ID:  Gregory Sanchez, DOB [birth year], MRN [id number]  PCP:  Ginger Organ., MD  Cardiologist:  Rex Kras, DO, [facility] (established care 10/30/2020)  REASON FOR CONSULT: Precordial pain  REQUESTING PHYSICIAN:  Ginger Organ., MD [address] [place],  [place] [address]  Chief Complaint  Patient presents with   Chest Pain   New Patient (Initial Visit)    HPI  Gregory Sanchez is a 55 y.o. male who presents to the office with a chief complaint of " chest pain." Patient's past medical history and cardiovascular risk factors include: Moderate coronary artery calcification (CAC 148 AU, 94th percentile 10/2018), aortic atherosclerosis, benign essential hypertension, erectile dysfunction, mild OSA currently on CPAP, former smoker.  He is referred to the office at the request of Brigitte Pulse Emily Filbert., MD for evaluation of precordial pain.  Chest pain: Symptoms have been present for the last 2 weeks, last episode last week, substernally located, brought on by effort related activities, improves with resting for about 15 minutes, nonradiating, described as a burning-like sensation, 5 out of 10 in intensity.  Recently had a CT of the chest without contrast and being treated for pneumonia as well.  Patient states that he was having shortness of breath with effort related activities but in the recent past this has resolved.  He denies any near-syncope or syncope.  ALLERGIES: Allergies  Allergen Reactions   Penicillins Hives    Has patient had a PCN reaction causing immediate rash, facial/tongue/throat swelling, SOB or lightheadedness with hypotension: No Has patient had a PCN reaction causing severe rash involving mucus membranes or skin necrosis: No Has patient had a PCN reaction that required hospitalization No Has patient had a PCN reaction occurring within the last 10 years: Yes If all of the above answers are "NO", then may proceed with Cephalosporin use.     MEDICATION LIST PRIOR TO VISIT: Current Meds  Medication Sig   amLODipine-benazepril (LOTREL) 10-40 MG capsule Take 1 capsule by mouth daily.   aspirin EC 81 MG tablet Take 1 tablet (81 mg total) by mouth daily. Swallow whole.   ergocalciferol (VITAMIN D2) 1.25 MG (50000 UT) capsule Take 50,000 Units by mouth once a week.   metoprolol tartrate (LOPRESSOR) 25 MG tablet Take 1 tablet (25 mg total) by mouth 2 (two) times daily. Hold if systolic blood pressure (top number) less than 100 mmHg or pulse less than 60 bpm.   Multiple Vitamin (MULTIVITAMIN WITH MINERALS) TABS tablet Take 1 tablet by mouth daily.   pantoprazole (PROTONIX) 40 MG tablet Take 1 tablet (40 mg total) by mouth daily.   rosuvastatin (CRESTOR) 20 MG tablet Take 20 mg by mouth daily.     PAST MEDICAL HISTORY: Past Medical History:  Diagnosis Date   GERD (gastroesophageal reflux disease)    no med, diet controlled   Hyperlipidemia    Hypertension    Inguinal hernia    rt   Sleep apnea    "mild" - Does not have CPAP   Sleep apnea, obstructive    Mild - Does not have CPAP     PAST SURGICAL HISTORY: Past Surgical History:  Procedure Laterality Date   HERNIA REPAIR      FAMILY HISTORY: The patient family history includes Heart disease in his mother; Hypertension in his father and mother.  SOCIAL HISTORY:  The patient  reports that he quit smoking about 8 years ago. His smoking use included cigarettes. He  has a 13.50 pack-year smoking history. He has never used smokeless tobacco. He reports that he does not drink alcohol and does not use drugs.  REVIEW OF SYSTEMS: Review of Systems  Constitutional: Negative for chills and fever.  HENT:  Negative for hoarse voice and nosebleeds.   Eyes:  Negative for discharge, double vision and pain.  Cardiovascular:  Negative for chest pain, claudication, dyspnea on exertion, leg swelling, near-syncope, orthopnea, palpitations, paroxysmal nocturnal dyspnea and syncope.   Respiratory:  Negative for hemoptysis and shortness of breath.   Musculoskeletal:  Negative for muscle cramps and myalgias.  Gastrointestinal:  Negative for abdominal pain, constipation, diarrhea, hematemesis, hematochezia, melena, nausea and vomiting.  Neurological:  Negative for dizziness and light-headedness.   PHYSICAL EXAM: Vitals with BMI 10/30/2020 12/14/2019 03/01/2019  Height '5\' 11"'$  '5\' 11"'$  -  Weight 212 lbs 220 lbs -  BMI 0000000 [id number] -  Systolic [id number]  Diastolic 71 80 84  Pulse 80 71 61    CONSTITUTIONAL: Well-developed and well-nourished. No acute distress.  SKIN: Skin is warm and dry. No rash noted. No cyanosis. No pallor. No jaundice HEAD: Normocephalic and atraumatic.  EYES: No scleral icterus MOUTH/THROAT: Moist oral membranes.  NECK: No JVD present. No thyromegaly noted. No carotid bruits  LYMPHATIC: No visible cervical adenopathy.  CHEST Normal respiratory effort. No intercostal retractions  LUNGS: Clear to auscultation bilaterally.  No stridor. No wheezes. No rales.  CARDIOVASCULAR: Regular rate and rhythm, positive S1-S2, no murmurs rubs or gallops appreciated ABDOMINAL: Soft, nontender, nondistended, positive bowel sounds in all 4 quadrants, no apparent ascites.  EXTREMITIES: No peripheral edema, warm to touch bilaterally, 2+ DP and PT pulses HEMATOLOGIC: No significant bruising NEUROLOGIC: Oriented to person, place, and time. Nonfocal. Normal muscle tone.  PSYCHIATRIC: Normal mood and affect. Normal behavior. Cooperative  CARDIAC DATABASE: EKG: 10/30/2020: Normal sinus rhythm, 76 bpm, normal axis, without underlying ischemia or injury pattern.   Echocardiogram: No results found for this or any previous visit from the past 1095 days.   Stress Testing: Exercise nuclear stress test: 05/05/2015: ? The left ventricular ejection fraction is normal (55-65%). ? Nuclear stress EF: 59%. ? Blood pressure demonstrated a hypertensive response to exercise. ? There  was no ST segment deviation noted during stress. ? The study is normal. ? This is a low risk study.  Heart Catheterization: None   LABORATORY DATA: External labs: 12/02/2019 provided by PCP. AST 27, ALT 73, alkaline phosphatase 77  10/29/2019: Sodium 141, potassium 4.2, chloride 104, bicarb 19, BUN 15, creatinine 1.1 Hemoglobin 13.5 g/dL, hematocrit 40.7 Total cholesterol 88, triglycerides 74, HDL 39, LDL 34, non-HDL 49.  TSH 1.24  IMPRESSION:    ICD-10-CM   1. Precordial pain  R07.2 EKG 12-Lead    CT CORONARY MORPH W/CTA COR W/SCORE W/CA W/CM &/OR WO/CM    PCV ECHOCARDIOGRAM COMPLETE    Basic metabolic panel    metoprolol tartrate (LOPRESSOR) 25 MG tablet    aspirin EC 81 MG tablet    2. Dyspnea on exertion  R06.00 PCV ECHOCARDIOGRAM COMPLETE    Pro b natriuretic peptide (BNP)    3. Coronary atherosclerosis due to calcified coronary lesion  I25.10    I25.84     4. Atherosclerosis of aorta (HCC)  I70.0     5. Benign hypertension  I10     6. OSA on CPAP  G47.33    Z99.89     7. Former smoker  Z87.891        RECOMMENDATIONS:  Gregory Sanchez is a 55 y.o. male whose past medical history and cardiac risk factors include: Moderate coronary artery calcification (CAC 148 AU, 94th percentile 10/2018), aortic atherosclerosis, benign essential hypertension, erectile dysfunction, mild OSA currently on CPAP, former smoker.  Precordial pain: Highly suggestive of cardiac etiology. Had undergone a stress test back in 2017; however given his atypical symptoms recommend either coronary CTA or left heart catheterization.  The shared decision was to proceed with coronary CTA at this time. EKG nonischemic Lopressor 25 mg p.o. twice daily with holding parameters Check BMP and BNP Start aspirin 81 mg p.o. daily Continue statin. Recommended prescribing sublingual nitroglycerin tablets to use on a as needed basis.  However, patient refused and states that his symptoms were to get worse  and not he will go to the hospital. Until work-up is not completed have asked him to not overexert himself.   Dyspnea on exertion: Overall euvolemic and not in congestive heart failure. Echocardiogram will be ordered to evaluate for structural heart disease and left ventricular systolic function. Check BNP  Moderate coronary artery calcification: Recommend aspirin 81 mg p.o. daily.  However, patient states that he was advised not to be on aspirin.  I have asked him to reach out to the provider and if cleared would recommend aspirin 81 mg given his moderate CAC placing him at the 94th percentile. Continue statin therapy.  Benign essential hypertension Office blood pressures within excellent control Continue current medical therapy Low-salt diet recommended Currently managed by primary care provider.  OSA on CPAP: Encouraged compliance.  As part of this consultation reviewed outside records provided by the referring physician which included office notes and labs.  Which were independently reviewed and impacted the medical decision making during today's encounter pertinent findings summarized above.  FINAL MEDICATION LIST END OF ENCOUNTER: Meds ordered this encounter  Medications   metoprolol tartrate (LOPRESSOR) 25 MG tablet    Sig: Take 1 tablet (25 mg total) by mouth 2 (two) times daily. Hold if systolic blood pressure (top number) less than 100 mmHg or pulse less than 60 bpm.    Dispense:  60 tablet    Refill:  0   aspirin EC 81 MG tablet    Sig: Take 1 tablet (81 mg total) by mouth daily. Swallow whole.    Dispense:  30 tablet    Refill:  11    Medications Discontinued During This Encounter  Medication Reason   famotidine (PEPCID) 40 MG tablet Error     Current Outpatient Medications:    amLODipine-benazepril (LOTREL) 10-40 MG capsule, Take 1 capsule by mouth daily., Disp: , Rfl:    aspirin EC 81 MG tablet, Take 1 tablet (81 mg total) by mouth daily. Swallow whole., Disp:  30 tablet, Rfl: 11   ergocalciferol (VITAMIN D2) 1.25 MG (50000 UT) capsule, Take 50,000 Units by mouth once a week., Disp: , Rfl:    metoprolol tartrate (LOPRESSOR) 25 MG tablet, Take 1 tablet (25 mg total) by mouth 2 (two) times daily. Hold if systolic blood pressure (top number) less than 100 mmHg or pulse less than 60 bpm., Disp: 60 tablet, Rfl: 0   Multiple Vitamin (MULTIVITAMIN WITH MINERALS) TABS tablet, Take 1 tablet by mouth daily., Disp: , Rfl:    pantoprazole (PROTONIX) 40 MG tablet, Take 1 tablet (40 mg total) by mouth daily., Disp: 30 tablet, Rfl: 1   rosuvastatin (CRESTOR) 20 MG tablet, Take 20 mg by mouth daily., Disp: , Rfl:   Orders Placed This  Encounter  Procedures   CT CORONARY MORPH W/CTA COR W/SCORE W/CA W/CM &/OR WO/CM   Basic metabolic panel   Pro b natriuretic peptide (BNP)   EKG 12-Lead   PCV ECHOCARDIOGRAM COMPLETE    There are no Patient Instructions on file for this visit.   --Continue cardiac medications as reconciled in final medication list. --Return in about 3 weeks (around 11/20/2020) for Follow up, Chest pain, Review CCTA results. Or sooner if needed. --Continue follow-up with your primary care physician regarding the management of your other chronic comorbid conditions.  Patient's questions and concerns were addressed to his satisfaction. He voices understanding of the instructions provided during this encounter.   This note was created using a voice recognition software as a result there may be grammatical errors inadvertently enclosed that do not reflect the nature of this encounter. Every attempt is made to correct such errors.  Rex Kras, [place], [facility]  Pager: [phone] Office: [phone]

## 2020-11-09 ENCOUNTER — Ambulatory Visit: Payer: BC Managed Care – PPO

## 2020-11-09 ENCOUNTER — Other Ambulatory Visit: Payer: Self-pay

## 2020-11-09 DIAGNOSIS — R0609 Other forms of dyspnea: Secondary | ICD-10-CM

## 2020-11-09 DIAGNOSIS — R072 Precordial pain: Secondary | ICD-10-CM

## 2020-11-09 DIAGNOSIS — R0600 Dyspnea, unspecified: Secondary | ICD-10-CM

## 2020-11-14 ENCOUNTER — Telehealth (HOSPITAL_COMMUNITY): Payer: Self-pay | Admitting: Emergency Medicine

## 2020-11-14 NOTE — Telephone Encounter (Signed)
Reaching out to patient to offer assistance regarding upcoming cardiac imaging study; pt verbalizes understanding of appt date/time, parking situation and where to check in, pre-test NPO status and medications ordered, and verified current allergies; name and call back number provided for further questions should they arise Gregory Bond RN Navigator Cardiac Imaging [place] [facility] [phone] office [phone] cell  Pt reminded to have labs done prior to scan States he will go today '25mg'$  metop tart BID (take AM dose 2 hr prior to scan)

## 2020-11-15 LAB — BASIC METABOLIC PANEL
BUN/Creatinine Ratio: 15 (ref 9–20)
BUN: 16 mg/dL (ref 6–24)
CO2: 23 mmol/L (ref 20–29)
Calcium: 9.7 mg/dL (ref 8.7–10.2)
Chloride: 101 mmol/L (ref 96–106)
Creatinine, Ser: 1.04 mg/dL (ref 0.76–1.27)
Glucose: 115 mg/dL — ABNORMAL HIGH (ref 65–99)
Potassium: 4.1 mmol/L (ref 3.5–5.2)
Sodium: 138 mmol/L (ref 134–144)
eGFR: 85 mL/min/{1.73_m2} (ref >59)

## 2020-11-15 LAB — PRO B NATRIURETIC PEPTIDE: NT-Pro BNP: 25 pg/mL (ref 0–210)

## 2020-11-16 ENCOUNTER — Other Ambulatory Visit: Payer: Self-pay

## 2020-11-16 ENCOUNTER — Ambulatory Visit (HOSPITAL_COMMUNITY)
Admission: RE | Admit: 2020-11-16 | Discharge: 2020-11-16 | Disposition: A | Discharge status: Home / Self Care | Payer: BC Managed Care – PPO | Source: Ambulatory Visit | Attending: Cardiology | Admitting: Cardiology

## 2020-11-16 ENCOUNTER — Encounter (HOSPITAL_COMMUNITY): Payer: Self-pay

## 2020-11-16 DIAGNOSIS — R072 Precordial pain: Secondary | ICD-10-CM | POA: Diagnosis not present

## 2020-11-16 DIAGNOSIS — Z006 Encounter for examination for normal comparison and control in clinical research program: Secondary | ICD-10-CM

## 2020-11-16 MED ORDER — IOHEXOL 350 MG/ML SOLN
95.0000 mL | Freq: Once | INTRAVENOUS | Status: AC | PRN
  Administered 2020-11-16: 95 mL via INTRAVENOUS

## 2020-11-16 MED ORDER — NITROGLYCERIN 0.4 MG SL SUBL
SUBLINGUAL_TABLET | SUBLINGUAL | Status: AC
  Filled 2020-11-16: qty 2

## 2020-11-16 MED ORDER — NITROGLYCERIN 0.4 MG SL SUBL
0.8000 mg | SUBLINGUAL_TABLET | Freq: Once | SUBLINGUAL | Status: AC
  Administered 2020-11-16: 0.8 mg via SUBLINGUAL

## 2020-11-16 NOTE — Research (Signed)
IDENTIFY Informed Consent                  Subject Name:Gregory Sanchez   Subject met inclusion and exclusion criteria.  The informed consent form, study requirements and expectations were reviewed with the subject and questions and concerns were addressed prior to the signing of the consent form.  The subject verbalized understanding of the trial requirements.  The subject agreed to participate in the IDENTIFY trial and signed the informed consent at 07:50am on 11/16/20.  The informed consent was obtained prior to performance of any protocol-specific procedures for the subject.  A copy of the signed informed consent was given to the subject and a copy was placed in the subject's medical record.    Marylou Mccoy, Research Coordinator

## 2020-11-17 DIAGNOSIS — R072 Precordial pain: Secondary | ICD-10-CM | POA: Insufficient documentation

## 2020-11-23 ENCOUNTER — Encounter: Payer: Self-pay | Admitting: Cardiology

## 2020-11-23 ENCOUNTER — Ambulatory Visit: Payer: BC Managed Care – PPO | Admitting: Cardiology

## 2020-11-23 ENCOUNTER — Other Ambulatory Visit: Payer: Self-pay

## 2020-11-23 ENCOUNTER — Telehealth: Payer: Self-pay | Admitting: Family Medicine

## 2020-11-23 VITALS — BP 123/73 | HR 69 | Temp 98.0°F | Resp 16 | Ht 71.0 in | Wt 214.0 lb

## 2020-11-23 DIAGNOSIS — I2510 Atherosclerotic heart disease of native coronary artery without angina pectoris: Secondary | ICD-10-CM

## 2020-11-23 DIAGNOSIS — I700 Atherosclerosis of aorta: Secondary | ICD-10-CM

## 2020-11-23 DIAGNOSIS — G4733 Obstructive sleep apnea (adult) (pediatric): Secondary | ICD-10-CM

## 2020-11-23 DIAGNOSIS — Z87891 Personal history of nicotine dependence: Secondary | ICD-10-CM

## 2020-11-23 DIAGNOSIS — R072 Precordial pain: Secondary | ICD-10-CM

## 2020-11-23 DIAGNOSIS — I10 Essential (primary) hypertension: Secondary | ICD-10-CM

## 2020-11-23 DIAGNOSIS — I2584 Coronary atherosclerosis due to calcified coronary lesion: Secondary | ICD-10-CM

## 2020-11-23 NOTE — Telephone Encounter (Signed)
Sent community message to [employer] to find out when pt was set up and which machine he has. Waiting on response.

## 2020-11-23 NOTE — Progress Notes (Signed)
Date:  11/23/2020   ID:  [name], DOB 06-07-1965, MRN [id number]  PCP:  Ginger Organ., MD  Cardiologist:  Rex Kras, DO, [facility] (established care 10/30/2020)  Date: 11/23/20 Last Office Visit: 10/30/2020  Chief Complaint  Patient presents with   Chest Pain   Follow-up   Results    HPI  Gregory Sanchez is a 55 y.o. male who presents to the office with a chief complaint of " reevaluation of chest pain and discuss test results" Patient's past medical history and cardiovascular risk factors include: Moderate coronary artery calcification (CAC 215 AU, 95th percentile 11/2020), aortic atherosclerosis, benign essential hypertension, erectile dysfunction, mild OSA currently on CPAP, former smoker.  He is referred to the office at the request of Brigitte Pulse Emily Filbert., MD for evaluation of precordial pain.  During prior office visits patient's symptoms of chest discomfort are concerning for cardiac etiology and the shared decision was to proceed with coronary CTA given his multiple cardiovascular risk factors as outlined above.  Since last office visit patient states that his chest pain has not resurfaced.  No significant change in his overall functional capacity.  And denies any heart failure symptoms.  Most recent coronary CTA notes minimal nonobstructive CAD in the LAD distribution and a total coronary calcium score of 215 placing him at the 95th percentile for age/sex matched cohorts.  ALLERGIES: Allergies  Allergen Reactions   Penicillins Hives    Has patient had a PCN reaction causing immediate rash, facial/tongue/throat swelling, SOB or lightheadedness with hypotension: No Has patient had a PCN reaction causing severe rash involving mucus membranes or skin necrosis: No Has patient had a PCN reaction that required hospitalization No Has patient had a PCN reaction occurring within the last 10 years: Yes If all of the above answers are "NO", then may proceed with  Cephalosporin use.    MEDICATION LIST PRIOR TO VISIT: Current Meds  Medication Sig   amLODipine-benazepril (LOTREL) 10-40 MG capsule Take 1 capsule by mouth daily.   aspirin EC 81 MG tablet Take 1 tablet (81 mg total) by mouth daily. Swallow whole.   ergocalciferol (VITAMIN D2) 1.25 MG (50000 UT) capsule Take 50,000 Units by mouth once a week.   Multiple Vitamin (MULTIVITAMIN WITH MINERALS) TABS tablet Take 1 tablet by mouth daily.   pantoprazole (PROTONIX) 40 MG tablet Take 1 tablet (40 mg total) by mouth daily.   rosuvastatin (CRESTOR) 20 MG tablet Take 20 mg by mouth daily.   [DISCONTINUED] metoprolol tartrate (LOPRESSOR) 25 MG tablet TAKE 1 TABLET BY MOUTH TWICE DAILY. HOLD IF SYSTOLIC BLOOD PRESSURE(TOP NUMBER) LESS THAN 100 MMHG OR PULSE LESS THAN 60 BPM     PAST MEDICAL HISTORY: Past Medical History:  Diagnosis Date   GERD (gastroesophageal reflux disease)    no med, diet controlled   Hyperlipidemia    Hypertension    Inguinal hernia    rt   Sleep apnea    "mild" - Does not have CPAP   Sleep apnea, obstructive    Mild - Does not have CPAP     PAST SURGICAL HISTORY: Past Surgical History:  Procedure Laterality Date   HERNIA REPAIR      FAMILY HISTORY: The patient family history includes Heart disease in his mother; Hypertension in his father and mother.  SOCIAL HISTORY:  The patient  reports that he quit smoking about 8 years ago. His smoking use included cigarettes. He has a 13.50 pack-year smoking history. He has  never used smokeless tobacco. He reports that he does not drink alcohol and does not use drugs.  REVIEW OF SYSTEMS: Review of Systems  Constitutional: Negative for chills and fever.  HENT:  Negative for hoarse voice and nosebleeds.   Eyes:  Negative for discharge, double vision and pain.  Cardiovascular:  Negative for chest pain, claudication, dyspnea on exertion, leg swelling, near-syncope, orthopnea, palpitations, paroxysmal nocturnal dyspnea and  syncope.  Respiratory:  Negative for hemoptysis and shortness of breath.   Musculoskeletal:  Negative for muscle cramps and myalgias.  Gastrointestinal:  Negative for abdominal pain, constipation, diarrhea, hematemesis, hematochezia, melena, nausea and vomiting.  Neurological:  Negative for dizziness and light-headedness.   PHYSICAL EXAM: Vitals with BMI 11/23/2020 11/16/2020 11/16/2020  Height '5\' 11"'$  - -  Weight 214 lbs - -  BMI [id number] - -  Systolic [id number]  Diastolic 73 70 68  Pulse 69 56 51    CONSTITUTIONAL: Well-developed and well-nourished. No acute distress.  SKIN: Skin is warm and dry. No rash noted. No cyanosis. No pallor. No jaundice HEAD: Normocephalic and atraumatic.  EYES: No scleral icterus MOUTH/THROAT: Moist oral membranes.  NECK: No JVD present. No thyromegaly noted. No carotid bruits  LYMPHATIC: No visible cervical adenopathy.  CHEST Normal respiratory effort. No intercostal retractions  LUNGS: Clear to auscultation bilaterally.  No stridor. No wheezes. No rales.  CARDIOVASCULAR: Regular rate and rhythm, positive S1-S2, no murmurs rubs or gallops appreciated ABDOMINAL: Soft, nontender, nondistended, positive bowel sounds in all 4 quadrants, no apparent ascites.  EXTREMITIES: No peripheral edema, warm to touch bilaterally, 2+ DP and PT pulses HEMATOLOGIC: No significant bruising NEUROLOGIC: Oriented to person, place, and time. Nonfocal. Normal muscle tone.  PSYCHIATRIC: Normal mood and affect. Normal behavior. Cooperative  CARDIAC DATABASE: EKG: 10/30/2020: Normal sinus rhythm, 76 bpm, normal axis, without underlying ischemia or injury pattern.   Echocardiogram: 11/09/2020: Normal LV systolic function with visual EF 60-65%. Left ventricle cavity is normal in size. Normal left ventricular wall thickness. Normal global wall motion. Normal diastolic filling pattern, normal LAP. Left atrial cavity is normal in size. A lipomatous septum is present. Mild tricuspid  regurgitation. No evidence of pulmonary hypertension. No prior study for comparison.   Stress Testing: Exercise nuclear stress test: 05/05/2015: ? The left ventricular ejection fraction is normal (55-65%). ? Nuclear stress EF: 59%. ? Blood pressure demonstrated a hypertensive response to exercise. ? There was no ST segment deviation noted during stress. ? The study is normal. ? This is a low risk study.  CCTA 11/16/2020: 1. Total coronary calcium score of 215. This was 95th percentile for age and sex matched control. 2. Normal coronary origin with right dominance. 3. 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 LM: Patent LAD: Minimal stenosis (1-24%) proximal LAD due to calcified plaque. Mid to distal LAD is patent. LCX /OM1: patent with no evidence of plaque or stenosis. RCA: minimal diffuse irregularities due to calcified plaque. Mid and distal RCA overall patent but due to artifact the accuracy is reduced. 4. No significant incidental findings. Stable right lower lobe scarring.  Heart Catheterization: None   LABORATORY DATA: External labs: 12/02/2019 provided by PCP. AST 27, ALT 73, alkaline phosphatase 77  10/29/2019: Sodium 141, potassium 4.2, chloride 104, bicarb 19, BUN 15, creatinine 1.1 Hemoglobin 13.5 g/dL, hematocrit 40.7 Total cholesterol 88, triglycerides 74, HDL 39, LDL 34, non-HDL 49.  TSH 1.24  IMPRESSION:    ICD-10-CM   1. Precordial pain  R07.2     2. Coronary  atherosclerosis due to calcified coronary lesion  I25.10    I25.84     3. Atherosclerosis of aorta (HCC)  I70.0     4. Benign hypertension  I10     5. OSA on CPAP  G47.33    Z99.89     6. Former smoker  Z87.891        RECOMMENDATIONS: Gregory Sanchez is a 55 y.o. male whose past medical history and cardiac risk factors include: Moderate coronary artery calcification (CAC 215 AU, 95th percentile 11/2020), aortic atherosclerosis, benign essential hypertension, erectile dysfunction, mild OSA  currently on CPAP, former smoker.  Precordial pain: No reoccurrence of chest pain since last office encounter. Echocardiogram notes preserved LVEF, normal diastolic function, no significant valvular heart disease. Coronary CTA notes very minimal nonobstructive CAD in the LAD distribution.   Educated importance of secondary prevention. Will discontinue Lopressor  Continue aspirin and statin therapy.    Moderate coronary artery calcification: Continue aspirin and statin therapy. Educated in the importance of secondary prevention.  Benign essential hypertension Office blood pressures within excellent control Continue current medical therapy Low-salt diet recommended Currently managed by primary care provider.  OSA on CPAP: Encouraged compliance.  As part of this office encounter independently reviewed results of the echocardiogram and coronary CTA with the patient in great detail at today's visit.  I would like to see him back on an annual basis after his yearly physical to reevaluate his symptoms and risk factors.   FINAL MEDICATION LIST END OF ENCOUNTER: No orders of the defined types were placed in this encounter.   Medications Discontinued During This Encounter  Medication Reason   metoprolol tartrate (LOPRESSOR) 25 MG tablet      Current Outpatient Medications:    amLODipine-benazepril (LOTREL) 10-40 MG capsule, Take 1 capsule by mouth daily., Disp: , Rfl:    aspirin EC 81 MG tablet, Take 1 tablet (81 mg total) by mouth daily. Swallow whole., Disp: 30 tablet, Rfl: 11   ergocalciferol (VITAMIN D2) 1.25 MG (50000 UT) capsule, Take 50,000 Units by mouth once a week., Disp: , Rfl:    Multiple Vitamin (MULTIVITAMIN WITH MINERALS) TABS tablet, Take 1 tablet by mouth daily., Disp: , Rfl:    pantoprazole (PROTONIX) 40 MG tablet, Take 1 tablet (40 mg total) by mouth daily., Disp: 30 tablet, Rfl: 1   rosuvastatin (CRESTOR) 20 MG tablet, Take 20 mg by mouth daily., Disp: , Rfl:   No  orders of the defined types were placed in this encounter.   There are no Patient Instructions on file for this visit.   --Continue cardiac medications as reconciled in final medication list. --Return in about 1 year (around 11/23/2021) for Follow up, Coronary artery calcification. Or sooner if needed. --Continue follow-up with your primary care physician regarding the management of your other chronic comorbid conditions.  Patient's questions and concerns were addressed to his satisfaction. He voices understanding of the instructions provided during this encounter.   This note was created using a voice recognition software as a result there may be grammatical errors inadvertently enclosed that do not reflect the nature of this encounter. Every attempt is made to correct such errors.  Rex Kras, [place], [facility]  Pager: [phone] Office: [phone]

## 2020-11-23 NOTE — Telephone Encounter (Signed)
You can offer 11/27/20 at 3:30pm with Dr. Brett Fairy. Please remind the pt to bring his machine and power cord with him to this appointment. We cannot pull data offline

## 2020-11-23 NOTE — Telephone Encounter (Signed)
Per [employer]: setup on 07/25 and he is using an [employer]

## 2020-11-23 NOTE — Telephone Encounter (Signed)
Pt has initial cpap f/u scheduled for 9/27 with Amy Lomax. This appt needs r/s for provider meeting that day, is there anywhere this pt can be worked in so he can be seen within required insurance dates for CPAP?

## 2020-11-27 ENCOUNTER — Encounter: Payer: Self-pay | Admitting: Neurology

## 2020-11-27 ENCOUNTER — Ambulatory Visit: Payer: BC Managed Care – PPO | Admitting: Neurology

## 2020-11-27 ENCOUNTER — Other Ambulatory Visit: Payer: Self-pay

## 2020-11-27 VITALS — BP 118/67 | HR 72 | Ht 71.0 in | Wt 212.0 lb

## 2020-11-27 DIAGNOSIS — Z9989 Dependence on other enabling machines and devices: Secondary | ICD-10-CM | POA: Diagnosis not present

## 2020-11-27 DIAGNOSIS — R0683 Snoring: Secondary | ICD-10-CM

## 2020-11-27 DIAGNOSIS — Z0289 Encounter for other administrative examinations: Secondary | ICD-10-CM

## 2020-11-27 DIAGNOSIS — G4733 Obstructive sleep apnea (adult) (pediatric): Secondary | ICD-10-CM | POA: Diagnosis not present

## 2020-11-27 NOTE — Patient Instructions (Addendum)
PSG confirmed that sleep apnea was still present and CPAP/ BiPAP titration confirmed CPAP to be most helpful.  The patient has been highly compliant with CPAP use and is fulfilling DOT requirements.  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 No fatigue.  He should pass his DOT compliance requirements.   Quality Sleep Information, Adult Quality sleep is important for your mental and physical health. It also improves your quality of life. Quality sleep means you: Are asleep for most of the time you are in bed. Fall asleep within 30 minutes. Wake up no more than once a night.  Are awake for no longer than 20 minutes if you do wake up during the night. Most adults need 7-8 hours of quality sleep each night. How can poor sleep affect me? If you do not get enough quality sleep, you may have: Mood swings. Daytime sleepiness. Confusion. Decreased reaction time. Sleep disorders, such as insomnia and sleep apnea. Difficulty with: Solving problems. Coping with stress. Paying attention. These issues may affect your performance and productivity at work, school, and at home. Lack of sleep may also put you at higher risk for accidents, suicide, and risky behaviors. If you do not get quality sleep you may also be at higher risk for several health problems, including: Infections. Type 2 diabetes. Heart disease. High blood pressure. Obesity. Worsening of long-term conditions, like arthritis, kidney disease, depression, Parkinson's disease, and epilepsy. What actions can I take to get more quality sleep?   Stick to a sleep schedule. Go to sleep and wake up at about the same time each day. Do not try to sleep less on weekdays and make up for lost sleep on weekends. This does not work. Try to get about 30 minutes of exercise on most days. Do not exercise 2-3 hours before going to bed. Limit naps during the day to 30 minutes or less. Do not use any products that  contain nicotine or tobacco, such as cigarettes or e-cigarettes. If you need help quitting, ask your health care provider. Do not drink caffeinated beverages for at least 8 hours before going to bed. Coffee, tea, and some sodas contain caffeine. Do not drink alcohol close to bedtime. Do not eat large meals close to bedtime. Do not take naps in the late afternoon. Try to get at least 30 minutes of sunlight every day. Morning sunlight is best. Make time to relax before bed. Reading, listening to music, or taking a hot bath promotes quality sleep. Make your bedroom a place that promotes quality sleep. Keep your bedroom dark, quiet, and at a comfortable room temperature. Make sure your bed is comfortable. Take out sleep distractions like TV, a computer, smartphone, and bright lights. If you are lying awake in bed for longer than 20 minutes, get up and do a relaxing activity until you feel sleepy. Work with your health care provider to treat medical conditions that may affect sleeping, such as: Nasal obstruction. Snoring. Sleep apnea and other sleep disorders. Talk to your health care provider if you think any of your prescription medicines may cause you to have difficulty falling or staying asleep. If you have sleep problems, talk with a sleep consultant. If you think you have a sleep disorder, talk with your health care provider about getting evaluated by a specialist. Where to find more information [place] website: https://sleepfoundation.org National Heart, Lung, and [place] ([place]): http://www.saunders.info/.pdf Centers for Disease Control and Prevention (CDC): LearningDermatology.pl Contact  a health care provider if you: Have trouble getting to sleep or staying asleep. Often wake up very early in the morning and cannot get back to sleep. Have daytime sleepiness. Have daytime sleep attacks of suddenly falling asleep and sudden  muscle weakness (narcolepsy). Have a tingling sensation in your legs with a strong urge to move your legs (restless legs syndrome). Stop breathing briefly during sleep (sleep apnea). Think you have a sleep disorder or are taking a medicine that is affecting your quality of sleep. Summary Most adults need 7-8 hours of quality sleep each night. Getting enough quality sleep is an important part of health and well-being. Make your bedroom a place that promotes quality sleep and avoid things that may cause you to have poor sleep, such as alcohol, caffeine, smoking, and large meals. Talk to your health care provider if you have trouble falling asleep or staying asleep. This information is not intended to replace advice given to you by your health care provider. Make sure you discuss any questions you have with your health care provider. Document Revised: 06/11/2017 Document Reviewed: 06/11/2017 Elsevier Patient Education  [place].

## 2020-11-27 NOTE — Progress Notes (Addendum)
SLEEP MEDICINE CLINIC    Provider:  Larey Seat, MD  Primary Care Physician:  Ginger Organ., MD [place] [address]     Referring Provider: Ginger Organ., Md [address] [place],  [place] [address]          Chief Complaint according to patient   Patient presents with:     New Patient (Initial Visit)           HISTORY OF PRESENT ILLNESS:  MARKEE LIENEMANN is a 55 y.o. African American male patient seen here  on 11/27/2020 from Dr Brigitte Pulse .  He is now seen in a RV . [name] reports that he is still in the process of getting used to his machine he is using a APAP machine with a pressure variability between 6 and 16 cmH2O no EPR his 95th percentile pressure has been only 6.6 cm.  His 95th percentile air leak is low 2.6 L/min his residual AHI is 1.6 without central apneas emerging.  He has been 97% compliant over the last 30 days and by hours 80% compliant with an average of 4 hours and 50 minutes.  So looking through his hip milligram and compliance download there were some nights when the mask probably slipped and he therefore had a lot of air leakage but on general the mask fits him well and his AHI is excellently controlled I would like to remind that the patient was sent here by Dr. Delorse Limber as read his primary cardiologist and he had a first sleep study in 2016 and on 10-29-2014 had return for CPAP titration.  This time he had complex mostly obstructive sleep apnea with an AHI of 9.3 that was supine dependent no periodic limb movement disorder and just snoring was noted.  So I recommended to try auto titration again the patient seems to have done extremely well he went through an in lab titration on [id number] under the guidance of she now feels respiratory technologist.  Starting at 5 cm water pressure he was titrated up to 16 when he still had some apnea.  He was then switched to BiPAP.  It appears that the sweet spot for his nocturnal titration was  actually at 14 cmH2O, however.   I am happy to see that he is now doing excellent with only 6.6 cm need at night.  So I would offer him a sleep aid if he has trouble falling asleep with the CPAP.  The patient states that his wife has been very happy because he is no longer snoring so There has been some success here his Epworth sleepiness score was decreased to 12 points and his fatigue severity score was not endorsed today.  He sleeps more on his side now, which allowed for low pressure at 955.               Chief concern according to patient :  More witnessed apneas and getting more tired.    I have the pleasure of seeing SAMIUL ACREY today, a right-handed  [place] American male with an untreated  sleep disorder.  He  has a past medical history of Atypical chest pain, GERD (gastroesophageal reflux disease), Hyperlipidemia, Hypertension, Inguinal hernia,  and Sleep apnea, obstructive..   The patient had the first sleep study in the year 2016-the patient was diagnosed with obstructive sleep apnea and returned for a CPAP titration between 5 and 15 cmH2O.  This was on 11/15/2014  for a little bit more than 5 years ago and also his baseline AHI was 15.2 severe sleep apnea the increase in CPAP did not reduce his apneas overall significantly there was a very brief time under 13 cm water pressure where the AHI was 0.0 he only slept 6.8 minutes.  He was then changed to BiPAP and on the BiPAP with a respiratory rate of 12 he tolerated best 19/15 cmH2O.  This still is a very high pressure and many patients will not long-term find comfort and BiPAP settings was high.  He is here today because he never obtained the machine and what I would like to do is to retest him to have a new baseline.  He does suffer from high cholesterol and hypertension and he has been known to snore, have apnea and at this time considers himself sleepier than usual his Epworth Sleepiness Scale was endorsed at 12 out of 24  points while his fatigue severity scale was only endorsed at 15 points.  I reviewed Dr. Buel Ream laboratory notes to include an elevated ALT at 81, MPV at 5.9 is lower than usual, cholesterol was actually low and seems to be well medication controlled now the total cholesterol level was 88 HDL was 39 LDL to HDL ratio was 4.9 TSH PSA and HbA1c were normal limits.  Dr. 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  He has been vaccinated for COVID-19 with the [place] vaccine the first charge on 10th of August, second shot 11-24-2019.     Family medical /sleep history: No other family member with OSA, insomnia, sleep walkers.    Social history:  Patient is working as a Futures trader, full time, outdoors.  and lives in a household with spouse.  The couple has adult children. Pets are not present. Tobacco use: quit 2014.  ETOH use - none , Caffeine intake in form of Coffee(2 cups ) Soda( /) Tea ( /) or energy drinks.    Sleep habits are as follows: The patient's dinner time is between 6 PM. The patient goes to bed at 9-10 PM and continues to sleep for several  hours, wakes for physical discomfort, 2 bathroom breaks.The preferred sleep position is supine and laterally, with the support of 1 pillow.  Dreams are reportedly infrequent.  He gets 6 hours of sleep, the bedroom is cool, quiet and dark.  4 AM  is the usual rise time. The patient wakes up with an alarm at 4 AM..  He reports not feeling refreshed or restored in AM, with symptoms such as dry mouth ,but no  morning headaches.  Naps are taken frequently, lasting from 3-4 hours on weekends and are as refreshing as nocturnal sleep.    Review of Systems: Out of a complete 14 system review, the patient complains of only the  following symptoms, and all other reviewed systems are negative.:  Fatigue, sleepiness , loud snoring, fragmented sleep, Insomnia- reduced sleep time at night, discomfort from muscle or joint pain. Arms get numb.   How likely are you to doze in the following situations: 0 = not likely, 1 = slight chance, 2 = moderate chance, 3 = high chance   Sitting and Reading? Watching Television? Sitting inactive in a public place (theater or meeting)? As a passenger in a car for an hour without  a break? Lying down in the afternoon when circumstances permit? Sitting and talking to someone? Sitting quietly after lunch without alcohol? In a car, while stopped for a few minutes in traffic?   Total = 12/ 24 points  With naps.  Unchanged, daily nap less than one hour long, no more snoring.   FSS endorsed at N/A/ 63 points.   Social History   Socioeconomic History   Marital status: Single    Spouse name: Not on file   Number of children: Not on file   Years of education: Not on file   Highest education level: Not on file  Occupational History   Not on file  Tobacco Use   Smoking status: Former    Packs/day: 0.50    Years: 27.00    Pack years: 13.50    Types: Cigarettes    Quit date: 07/06/2012    Years since quitting: 8.4   Smokeless tobacco: Never  Vaping Use   Vaping Use: Every day  Substance and Sexual Activity   Alcohol use: No   Drug use: No   Sexual activity: Not on file  Other Topics Concern   Not on file  Social History Narrative   Denies caffeine use.   Social Determinants of Health   Financial Resource Strain: Not on file  Food Insecurity: Not on file  Transportation Needs: Not on file  Physical Activity: Not on file  Stress: Not on file  Social Connections: Not on file    Family History  Problem Relation Age of Onset   Heart disease Mother    Hypertension Mother    Hypertension Father    Colon cancer Neg Hx    Esophageal cancer Neg Hx    Rectal cancer Neg Hx     Stomach cancer Neg Hx     Past Medical History:  Diagnosis Date   GERD (gastroesophageal reflux disease)    no med, diet controlled   Hyperlipidemia    Hypertension    Inguinal hernia    rt   Sleep apnea    "mild" - Does not have CPAP   Sleep apnea, obstructive    Mild - Does not have CPAP     Past Surgical History:  Procedure Laterality Date   HERNIA REPAIR       Current Outpatient Medications on File Prior to Visit  Medication Sig Dispense Refill   amLODipine-benazepril (LOTREL) 10-40 MG capsule Take 1 capsule by mouth daily.     aspirin EC 81 MG tablet Take 1 tablet (81 mg total) by mouth daily. Swallow whole. 30 tablet 11   ergocalciferol (VITAMIN D2) 1.25 MG (50000 UT) capsule Take 50,000 Units by mouth once a week.     Multiple Vitamin (MULTIVITAMIN WITH MINERALS) TABS tablet Take 1 tablet by mouth daily.     pantoprazole (PROTONIX) 40 MG tablet Take 1 tablet (40 mg total) by mouth daily. 30 tablet 1   rosuvastatin (CRESTOR) 20 MG tablet Take 20 mg by mouth daily.     No current facility-administered medications on file prior to visit.    Allergies  Allergen Reactions   Penicillins Hives    Has patient had a PCN reaction causing immediate rash, facial/tongue/throat swelling, SOB or lightheadedness with hypotension: No Has patient had a PCN reaction causing severe rash involving mucus membranes or skin necrosis: No Has patient had a PCN reaction that required hospitalization No Has patient had a PCN reaction occurring within the last 10 years: Yes If all of  the above answers are "NO", then may proceed with Cephalosporin use.    Physical exam:  Today's Vitals   11/27/20 1507  BP: 118/67  Pulse: 72  Weight: 212 lb (96.2 kg)  Height: '5\' 11"'$  (1.803 m)   Body mass index is 29.57 kg/m.   Wt Readings from Last 3 Encounters:  11/27/20 212 lb (96.2 kg)  11/23/20 214 lb (97.1 kg)  10/30/20 212 lb (96.2 kg)     Ht Readings from Last 3 Encounters:  11/27/20  '5\' 11"'$  (1.803 m)  11/23/20 '5\' 11"'$  (1.803 m)  10/30/20 '5\' 11"'$  (1.803 m)      General: The patient is awake, alert and appears not in acute distress. The patient is well groomed. Head: Normocephalic, atraumatic. Neck is supple. Mallampati 3,  neck circumference:16.5 inches . Nasal airflow patent.  Retrognathia is  seen.  Dental status: n/a  Cardiovascular:  Regular rate and cardiac rhythm by pulse,  without distended neck veins. Respiratory: Lungs are clear to auscultation.  Skin:  Without evidence of ankle edema, or rash. Trunk: The patient's posture is erect.   Neurologic exam : The patient is awake and alert, oriented to place and time.   Memory subjective described as intact.  Attention span & concentration ability appears normal.  Speech is fluent,  without  dysarthria, dysphonia or aphasia.  Mood and affect are appropriate.   Cranial nerves: no loss of smell or taste reported  Pupils are equal and briskly reactive to light. Funduscopic exam deferred.   Extraocular movements in vertical and horizontal planes were intact and without nystagmus. No Diplopia. Visual fields by finger perimetry are intact. Hearing was intact to soft voice and finger rubbing.    Facial sensation intact to fine touch.  Facial motor strength is symmetric and tongue and uvula move midline.  Neck ROM : rotation, tilt and flexion extension were normal for age and shoulder shrug was symmetrical.    Motor exam:  Symmetric bulk, tone and ROM.   Normal tone without cog wheeling, symmetric grip strength .   Sensory:  Fine touch, pinprick and vibration were tested  and  normal.  Proprioception tested in the upper extremities was normal.   Coordination: Rapid alternating movements in the fingers/hands were of normal speed.  The Finger-to-nose maneuver was intact without evidence of ataxia, dysmetria or tremor.   Gait and station: Patient could rise unassisted from a seated position, walked without assistive  device.  Stance is of normal width/ base .  Toe and heel walk were deferred.  Deep tendon reflexes: in the  upper and lower extremities are symmetric and intact.  Babinski response was deferred.        After spending a total time of  35 minutes face to face and additional time for physical and neurologic examination, review of laboratory studies,  personal review of imaging studies, reports and results of other testing and review of referral information / records as far as provided in visit, I have established the following assessments:  1) PSG confirmed sleep apnea and CPAP/ BiPAP titration confirmed CPAP to be most helpful.   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 No fatigue.  He should pass his DOT compliance requirements.     I would like to thank Dr Rex Kras, DO and  Ginger Organ., Md [address] [place],  [place] [address] for allowing me to meet with and to take care of this pleasant patient.  In short, Gregory Sanchez is presenting with treated OSA. I plan to follow up either personally or through our NP within 12 month.   CC: I will share my notes with PCP.   Electronically signed by: Larey Seat, MD 11/27/2020 3:32 PM  [facility] Board certified by The [employer] of Sleep Medicine and Diplomate of the [employer] of Sleep Medicine. Board certified In Neurology through the [place], Fellow of the [employer] of Neurology. Medical Director of [facility].

## 2020-12-12 ENCOUNTER — Ambulatory Visit: Payer: Self-pay | Admitting: Family Medicine

## 2020-12-29 ENCOUNTER — Encounter: Payer: Self-pay | Admitting: Gastroenterology

## 2021-03-05 ENCOUNTER — Encounter: Payer: Self-pay | Admitting: Gastroenterology

## 2021-03-23 ENCOUNTER — Other Ambulatory Visit: Payer: Self-pay

## 2021-03-23 ENCOUNTER — Ambulatory Visit (AMBULATORY_SURGERY_CENTER): Payer: BC Managed Care – PPO | Admitting: *Deleted

## 2021-03-23 VITALS — Ht 71.0 in | Wt 211.0 lb

## 2021-03-23 DIAGNOSIS — D132 Benign neoplasm of duodenum: Secondary | ICD-10-CM

## 2021-03-23 DIAGNOSIS — Z86010 Personal history of colonic polyps: Secondary | ICD-10-CM

## 2021-03-23 MED ORDER — PEG 3350-KCL-NA BICARB-NACL 420 G PO SOLR: 4000.0000 mL | Freq: Once | ORAL | 0 refills | Status: AC

## 2021-03-23 NOTE — Progress Notes (Signed)

## 2021-04-05 ENCOUNTER — Encounter: Payer: Self-pay | Admitting: Gastroenterology

## 2021-04-11 ENCOUNTER — Ambulatory Visit (AMBULATORY_SURGERY_CENTER): Payer: BC Managed Care – PPO | Admitting: Gastroenterology

## 2021-04-11 ENCOUNTER — Other Ambulatory Visit: Payer: Self-pay

## 2021-04-11 ENCOUNTER — Encounter: Payer: Self-pay | Admitting: Gastroenterology

## 2021-04-11 VITALS — BP 114/72 | HR 59 | Temp 97.5°F | Resp 10 | Ht 71.0 in | Wt 200.0 lb

## 2021-04-11 DIAGNOSIS — D123 Benign neoplasm of transverse colon: Secondary | ICD-10-CM

## 2021-04-11 DIAGNOSIS — D132 Benign neoplasm of duodenum: Secondary | ICD-10-CM

## 2021-04-11 DIAGNOSIS — Z86010 Personal history of colonic polyps: Secondary | ICD-10-CM | POA: Diagnosis present

## 2021-04-11 MED ORDER — SODIUM CHLORIDE 0.9 % IV SOLN: 500.0000 mL | INTRAVENOUS | Status: DC

## 2021-04-11 NOTE — Op Note (Signed)
[facility] Patient Name: Calder Oblinger Procedure Date: 04/11/2021 1:31 PM MRN: [id number] Endoscopist: Ladene Artist , MD Age: 56 Referring MD:  Date of Birth: 06-Apr-1965 Gender: Male Account #: [id number] Procedure:                Colonoscopy Indications:              Surveillance: Personal history of adenomatous                            polyps on last colonoscopy > 5 years ago Medicines:                Monitored Anesthesia Care Procedure:                Pre-Anesthesia Assessment:                           - Prior to the procedure, a History and Physical                            was performed, and patient medications and                            allergies were reviewed. The patient's tolerance of                            previous anesthesia was also reviewed. The risks                            and benefits of the procedure and the sedation                            options and risks were discussed with the patient.                            All questions were answered, and informed consent                            was obtained. Prior Anticoagulants: The patient has                            taken no previous anticoagulant or antiplatelet                            agents. ASA Grade Assessment: II - A patient with                            mild systemic disease. After reviewing the risks                            and benefits, the patient was deemed in                            satisfactory condition to undergo the procedure.  After obtaining informed consent, the colonoscope                            was passed under direct vision. Throughout the                            procedure, the patient's blood pressure, pulse, and                            oxygen saturations were monitored continuously. The                            Olympus CF-HQ190L [id number]) Colonoscope was                            introduced through the anus  and advanced to the the                            cecum, identified by appendiceal orifice and                            ileocecal valve. The ileocecal valve, appendiceal                            orifice, and rectum were photographed. The quality                            of the bowel preparation was adequate. The                            colonoscopy was performed without difficulty. The                            patient tolerated the procedure well. Scope In: 1:34:02 PM Scope Out: 1:50:05 PM Scope Withdrawal Time: 0 hours 13 minutes 45 seconds  Total Procedure Duration: 0 hours 16 minutes 3 seconds  Findings:                 The perianal and digital rectal examinations were                            normal.                           A 7 mm polyp was found in the transverse colon. The                            polyp was sessile. The polyp was removed with a                            cold snare. Resection and retrieval were complete.                           A 3 mm polyp was found in the transverse colon. The  polyp was sessile. The polyp was removed with a                            cold biopsy forceps. Resection and retrieval were                            complete.                           The exam was otherwise without abnormality on                            direct and retroflexion views. Complications:            No immediate complications. Estimated blood loss:                            None. Estimated Blood Loss:     Estimated blood loss: none. Impression:               - One 7 mm polyp in the transverse colon, removed                            with a cold snare. Resected and retrieved.                           - One 3 mm polyp in the transverse colon, removed                            with a cold biopsy forceps. Resected and retrieved.                           - The examination was otherwise normal on direct                             and retroflexion views. Recommendation:           - Repeat colonoscopy after studies are complete for                            surveillance based on pathology results.                           - Patient has a contact number available for                            emergencies. The signs and symptoms of potential                            delayed complications were discussed with the                            patient. Return to normal activities tomorrow.                            Written  discharge instructions were provided to the                            patient.                           - Resume previous diet.                           - Continue present medications.                           - Await pathology results. Ladene Artist, MD 04/11/2021 1:52:47 PM This report has been signed electronically.

## 2021-04-11 NOTE — Progress Notes (Signed)
Per patient he ate tuna yesterday at 12:00 pm.  He says his stools are all liquid but brown and cloudy.  Advised pt that if he is not cleaned out procedure may not be able to be completed.  Pt wants to proceed.

## 2021-04-11 NOTE — Progress Notes (Signed)
History & Physical  Primary Care Physician:  Ginger Organ., MD Primary Gastroenterologist: Lucio Edward, MD  CHIEF COMPLAINT: Personal history of a duodenal adenoma, Personal history of colon polyps   HPI: Gregory Sanchez is a 56 y.o. male with a personal history of 3 adenomatous colon polyps in 2017.  History of a duodenal adenoma removed in 2020.  He is here today for colonoscopy and EGD.    Past Medical History:  Diagnosis Date   GERD (gastroesophageal reflux disease)    no med, diet controlled   Hyperlipidemia    Hypertension    Inguinal hernia    rt   Sleep apnea    "mild" - Does wear  CPAP   Sleep apnea, obstructive    Mild - Does have CPAP    Past Surgical History:  Procedure Laterality Date   COLONOSCOPY     HERNIA REPAIR     POLYPECTOMY     UPPER GASTROINTESTINAL ENDOSCOPY      Prior to Admission medications   Medication Sig Start Date End Date Taking? Authorizing Provider  amLODipine-benazepril (LOTREL) 10-40 MG capsule Take 1 capsule by mouth daily.   Yes [provider]  aspirin EC 81 MG tablet Take 1 tablet (81 mg total) by mouth daily. Swallow whole. 10/30/20  Yes Tolia, Sunit, DO  ergocalciferol (VITAMIN D2) 1.25 MG (50000 UT) capsule Take 50,000 Units by mouth once a week.   Yes [provider]  Multiple Vitamin (MULTIVITAMIN WITH MINERALS) TABS tablet Take 1 tablet by mouth daily.   Yes [provider]  pantoprazole (PROTONIX) 40 MG tablet Take 1 tablet (40 mg total) by mouth daily. 03/01/19  Yes Ladene Artist, MD  rosuvastatin (CRESTOR) 20 MG tablet Take 20 mg by mouth daily. 12/07/19  Yes [provider]    Current Outpatient Medications  Medication Sig Dispense Refill   amLODipine-benazepril (LOTREL) 10-40 MG capsule Take 1 capsule by mouth daily.     aspirin EC 81 MG tablet Take 1 tablet (81 mg total) by mouth daily. Swallow whole. 30 tablet 11   ergocalciferol (VITAMIN D2) 1.25 MG (50000 UT) capsule  Take 50,000 Units by mouth once a week.     Multiple Vitamin (MULTIVITAMIN WITH MINERALS) TABS tablet Take 1 tablet by mouth daily.     pantoprazole (PROTONIX) 40 MG tablet Take 1 tablet (40 mg total) by mouth daily. 30 tablet 1   rosuvastatin (CRESTOR) 20 MG tablet Take 20 mg by mouth daily.     Current Facility-Administered Medications  Medication Dose Route Frequency Provider Last Rate Last Admin   0.9 %  sodium chloride infusion  500 mL Intravenous Continuous Ladene Artist, MD        Allergies as of 04/11/2021 - Review Complete 04/11/2021  Allergen Reaction Noted   Penicillins Hives 08/05/2012    Family History  Problem Relation Age of Onset   Heart disease Mother    Hypertension Mother    Hypertension Father    Colon cancer Neg Hx    Esophageal cancer Neg Hx    Rectal cancer Neg Hx    Stomach cancer Neg Hx    Colon polyps Neg Hx     Social History   Socioeconomic History   Marital status: Single    Spouse name: Not on file   Number of children: Not on file   Years of education: Not on file   Highest education level: Not on file  Occupational History  Not on file  Tobacco Use   Smoking status: Former    Packs/day: 0.50    Years: 27.00    Pack years: 13.50    Types: Cigarettes    Quit date: 07/06/2012    Years since quitting: 8.7   Smokeless tobacco: Never  Vaping Use   Vaping Use: Every day  Substance and Sexual Activity   Alcohol use: No   Drug use: No   Sexual activity: Not on file  Other Topics Concern   Not on file  Social History Narrative   Denies caffeine use.   Social Determinants of Health   Financial Resource Strain: Not on file  Food Insecurity: Not on file  Transportation Needs: Not on file  Physical Activity: Not on file  Stress: Not on file  Social Connections: Not on file  Intimate Partner Violence: Not on file    Review of Systems:  All systems reviewed an negative except where noted in HPI.  Gen: Denies any fever, chills,  sweats, anorexia, fatigue, weakness, malaise, weight loss, and sleep disorder CV: Denies chest pain, angina, palpitations, syncope, orthopnea, PND, peripheral edema, and claudication. Resp: Denies dyspnea at rest, dyspnea with exercise, cough, sputum, wheezing, coughing up blood, and pleurisy. GI: Denies vomiting blood, jaundice, and fecal incontinence.   Denies dysphagia or odynophagia. GU : Denies urinary burning, blood in urine, urinary frequency, urinary hesitancy, nocturnal urination, and urinary incontinence. MS: Denies joint pain, limitation of movement, and swelling, stiffness, low back pain, extremity pain. Denies muscle weakness, cramps, atrophy.  Derm: Denies rash, itching, dry skin, hives, moles, warts, or unhealing ulcers.  Psych: Denies depression, anxiety, memory loss, suicidal ideation, hallucinations, paranoia, and confusion. Heme: Denies bruising, bleeding, and enlarged lymph nodes. Neuro:  Denies any headaches, dizziness, paresthesias. Endo:  Denies any problems with DM, thyroid, adrenal function.   Physical Exam: General:  Alert, well-developed, in NAD Head:  Normocephalic and atraumatic. Eyes:  Sclera clear, no icterus.   Conjunctiva pink. Ears:  Normal auditory acuity. Mouth:  No deformity or lesions.  Neck:  Supple; no masses . Lungs:  Clear throughout to auscultation.   No wheezes, crackles, or rhonchi. No acute distress. Heart:  Regular rate and rhythm; no murmurs. Abdomen:  Soft, nondistended, nontender. No masses, hepatomegaly. No obvious masses.  Normal bowel .    Rectal:  Deferred   Msk:  Symmetrical without gross deformities.. Pulses:  Normal pulses noted. Extremities:  Without edema. Neurologic:  Alert and  oriented x4;  grossly normal neurologically. Skin:  Intact without significant lesions or rashes. Cervical Nodes:  No significant cervical adenopathy. Psych:  Alert and cooperative. Normal mood and affect.   Impression / Plan:   Personal history of  3 adenomatous colon polyps in 2017.  History of a duodenal adenoma removed in 2020.  He is here today for colonoscopy and EGD.  Pricilla Riffle. Fuller Plan  04/11/2021, 1:26 PM See Shea Evans, [facility], to contact our on call provider

## 2021-04-11 NOTE — Patient Instructions (Signed)
Handout provided on polyps.  YOU HAD AN ENDOSCOPIC PROCEDURE TODAY AT [facility]:   Refer to the procedure report that was given to you for any specific questions about what was found during the examination.  If the procedure report does not answer your questions, please call your gastroenterologist to clarify.  If you requested that your care partner not be given the details of your procedure findings, then the procedure report has been included in a sealed envelope for you to review at your convenience later.  YOU SHOULD EXPECT: Some feelings of bloating in the abdomen. Passage of more gas than usual.  Walking can help get rid of the air that was put into your GI tract during the procedure and reduce the bloating. If you had a lower endoscopy (such as a colonoscopy or flexible sigmoidoscopy) you may notice spotting of blood in your stool or on the toilet paper. If you underwent a bowel prep for your procedure, you may not have a normal bowel movement for a few days.  Please Note:  You might notice some irritation and congestion in your nose or some drainage.  This is from the oxygen used during your procedure.  There is no need for concern and it should clear up in a day or so.  SYMPTOMS TO REPORT IMMEDIATELY:  Following lower endoscopy (colonoscopy or flexible sigmoidoscopy):  Excessive amounts of blood in the stool  Significant tenderness or worsening of abdominal pains  Swelling of the abdomen that is new, acute  Fever of 100F or higher  Following upper endoscopy (EGD)  Vomiting of blood or coffee ground material  New chest pain or pain under the shoulder blades  Painful or persistently difficult swallowing  New shortness of breath  Fever of 100F or higher  Black, tarry-looking stools  For urgent or emergent issues, a gastroenterologist can be reached at any hour by calling [phone]. Do not use MyChart messaging for urgent concerns.    DIET:  We do recommend  a small meal at first, but then you may proceed to your regular diet.  Drink plenty of fluids but you should avoid alcoholic beverages for 24 hours.  ACTIVITY:  You should plan to take it easy for the rest of today and you should NOT DRIVE or use heavy machinery until tomorrow (because of the sedation medicines used during the test).    FOLLOW UP: Our staff will call the number listed on your records 48-72 hours following your procedure to check on you and address any questions or concerns that you may have regarding the information given to you following your procedure. If we do not reach you, we will leave a message.  We will attempt to reach you two times.  During this call, we will ask if you have developed any symptoms of COVID 19. If you develop any symptoms (ie: fever, flu-like symptoms, shortness of breath, cough etc.) before then, please call [phone].  If you test positive for Covid 19 in the 2 weeks post procedure, please call and report this information to [place].    If any biopsies were taken you will be contacted by phone or by letter within the next 1-3 weeks.  Please call us at [phone] if you have not heard about the biopsies in 3 weeks.    SIGNATURES/CONFIDENTIALITY: You and/or your care partner have signed paperwork which will be entered into your electronic medical record.  These signatures attest to the fact that that the  information above on your After Visit Summary has been reviewed and is understood.  Full responsibility of the confidentiality of this discharge information lies with you and/or your care-partner.

## 2021-04-11 NOTE — Op Note (Signed)
[place] Patient Name: Gregory Sanchez Procedure Date: 04/11/2021 1:30 PM MRN: [id number] Endoscopist: Ladene Artist , MD Age: 56 Referring MD:  Date of Birth: 10/26/1965 Gender: Male Account #: [id number] Procedure:                Upper GI endoscopy Indications:              Personal history of digestive disease a duodenal                            adenoma Medicines:                Monitored Anesthesia Care Procedure:                Pre-Anesthesia Assessment:                           - Prior to the procedure, a History and Physical                            was performed, and patient medications and                            allergies were reviewed. The patient's tolerance of                            previous anesthesia was also reviewed. The risks                            and benefits of the procedure and the sedation                            options and risks were discussed with the patient.                            All questions were answered, and informed consent                            was obtained. Prior Anticoagulants: The patient has                            taken no previous anticoagulant or antiplatelet                            agents. ASA Grade Assessment: II - A patient with                            mild systemic disease. After reviewing the risks                            and benefits, the patient was deemed in                            satisfactory condition to undergo the procedure.  After obtaining informed consent, the endoscope was                            passed under direct vision. Throughout the                            procedure, the patient's blood pressure, pulse, and                            oxygen saturations were monitored continuously. The                            GIF [id number] #[id number] was introduced through the                            mouth, and advanced to the third part of duodenum.                             The upper GI endoscopy was accomplished without                            difficulty. The patient tolerated the procedure                            well. Scope In: Scope Out: Findings:                 The esophagus was normal.                           The stomach was normal.                           The examined duodenum was normal except for one                            small scar, likely the prior polypectomy site. No                            apparent residual polyp however biopsies were taken                            with a cold forceps for histology.                           The cardia and gastric fundus were normal on                            retroflexion. Complications:            No immediate complications. Estimated Blood Loss:     Estimated blood loss: none. Impression:               - Normal esophagus.                           - Normal stomach.                           -  Normal examined duodenum except for one small                            scar, likely the prior polypectomy site. Biopsied. Recommendation:           - Patient has a contact number available for                            emergencies. The signs and symptoms of potential                            delayed complications were discussed with the                            patient. Return to normal activities tomorrow.                            Written discharge instructions were provided to the                            patient.                           - Resume previous diet.                           - Continue present medications.                           - Await pathology results. Ladene Artist, MD 04/11/2021 2:08:40 PM This report has been signed electronically.

## 2021-04-11 NOTE — Progress Notes (Signed)
Report to PACU, RN, vss, BBS= Clear.  

## 2021-04-11 NOTE — Progress Notes (Signed)
Called to room to assist during endoscopic procedure.  Patient ID and intended procedure confirmed with present staff. Received instructions for my participation in the procedure from the performing physician.  

## 2021-04-13 ENCOUNTER — Telehealth: Payer: Self-pay | Admitting: *Deleted

## 2021-04-13 NOTE — Telephone Encounter (Signed)
°  Follow up Call-  Call back number 04/11/2021 03/01/2019  Post procedure Call Back phone  # [phone]  Permission to leave phone message Yes Yes  Some recent data might be hidden     Patient questions:  Do you have a fever, pain , or abdominal swelling? No. Pain Score  0 *  Have you tolerated food without any problems? Yes.    Have you been able to return to your normal activities? Yes.    Do you have any questions about your discharge instructions: Diet   No. Medications  No. Follow up visit  No.  Do you have questions or concerns about your Care? No.  Actions: * If pain score is 4 or above: No action needed, pain <4.  Have you developed a fever since your procedure? no  2.   Have you had an respiratory symptoms (SOB or cough) since your procedure? no  3.   Have you tested positive for COVID 19 since your procedure no  4.   Have you had any family members/close contacts diagnosed with the COVID 19 since your procedure?  no   If yes to any of these questions please route to Joylene John, RN and Joella Prince, RN

## 2021-04-27 ENCOUNTER — Encounter: Payer: Self-pay | Admitting: Gastroenterology

## 2021-09-07 ENCOUNTER — Other Ambulatory Visit: Payer: Self-pay | Admitting: Internal Medicine

## 2021-09-07 DIAGNOSIS — R202 Paresthesia of skin: Secondary | ICD-10-CM

## 2021-09-27 ENCOUNTER — Other Ambulatory Visit: Payer: BC Managed Care – PPO

## 2021-10-18 ENCOUNTER — Ambulatory Visit
Admission: RE | Admit: 2021-10-18 | Discharge: 2021-10-18 | Disposition: A | Discharge status: Home / Self Care | Payer: BC Managed Care – PPO | Source: Ambulatory Visit | Attending: Internal Medicine | Admitting: Internal Medicine

## 2021-10-18 DIAGNOSIS — R202 Paresthesia of skin: Secondary | ICD-10-CM

## 2021-10-18 MED ORDER — GADOBENATE DIMEGLUMINE 529 MG/ML IV SOLN
19.0000 mL | Freq: Once | INTRAVENOUS | Status: AC | PRN
Start: 2021-10-18 — End: 2021-10-18
  Administered 2021-10-18: 19 mL via INTRAVENOUS

## 2021-11-16 ENCOUNTER — Other Ambulatory Visit: Payer: Self-pay | Admitting: Cardiology

## 2021-11-16 DIAGNOSIS — R072 Precordial pain: Secondary | ICD-10-CM

## 2021-11-23 ENCOUNTER — Ambulatory Visit: Payer: BC Managed Care – PPO | Admitting: Cardiology

## 2021-11-26 ENCOUNTER — Telehealth: Payer: Self-pay | Admitting: *Deleted

## 2021-11-26 NOTE — Telephone Encounter (Signed)
Called pt. Reminded him to bring cpap machine/power cord to appt tomorrow and to check in at 3:00pm. He verbalized understanding.

## 2021-11-26 NOTE — Progress Notes (Unsigned)
PATIENT: Gregory Sanchez DOB: 02/18/1966  REASON FOR VISIT: follow up HISTORY FROM: patient  No chief complaint on file.    HISTORY OF PRESENT ILLNESS:  11/26/21 ALL:  Gregory Sanchez is a 56 y.o. male here today for follow up for OSA on CPAP.      HISTORY: (copied from Dr Dohmeier's previous note)  He is now seen in a RV . Gregory Sanchez reports that he is still in the process of getting used to his machine he is using a APAP machine with a pressure variability between 6 and 16 cmH2O no EPR his 95th percentile pressure has been only 6.6 cm.  His 95th percentile air leak is low 2.6 L/min his residual AHI is 1.6 without central apneas emerging.  He has been 97% compliant over the last 30 days and by hours 80% compliant with an average of 4 hours and 50 minutes.  So looking through his hip milligram and compliance download there were some nights when the mask probably slipped and he therefore had a lot of air leakage but on general the mask fits him well and his AHI is excellently controlled I would like to remind that the patient was sent here by Dr. Delorse Limber as read his primary cardiologist and he had a first sleep study in 2016 and on 10-29-2014 had return for CPAP titration.  This time he had complex mostly obstructive sleep apnea with an AHI of 9.3 that was supine dependent no periodic limb movement disorder and just snoring was noted.  So I recommended to try auto titration again the patient seems to have done extremely well he went through an in lab titration on [phone] under the guidance of she now feels respiratory technologist.  Starting at 5 cm water pressure he was titrated up to 16 when he still had some apnea.  He was then switched to BiPAP.  It appears that the sweet spot for his nocturnal titration was actually at 14 cmH2O, however.   I am happy to see that he is now doing excellent with only 6.6 cm need at night.  So I would offer him a sleep aid if he has trouble falling asleep  with the CPAP.  The patient states that his wife has been very happy because he is no longer snoring so There has been some success here his Epworth sleepiness score was decreased to 12 points and his fatigue severity score was not endorsed today.  He sleeps more on his side now, which allowed for low pressure at 955.   REVIEW OF SYSTEMS: Out of a complete 14 system review of symptoms, the patient complains only of the following symptoms, and all other reviewed systems are negative.  ESS: previously 12/24  ALLERGIES: Allergies  Allergen Reactions   Penicillins Hives    Has patient had a PCN reaction causing immediate rash, facial/tongue/throat swelling, SOB or lightheadedness with hypotension: No Has patient had a PCN reaction causing severe rash involving mucus membranes or skin necrosis: No Has patient had a PCN reaction that required hospitalization No Has patient had a PCN reaction occurring within the last 10 years: Yes If all of the above answers are "NO", then may proceed with Cephalosporin use.    HOME MEDICATIONS: Outpatient Medications Prior to Visit  Medication Sig Dispense Refill   amLODipine-benazepril (LOTREL) 10-40 MG capsule Take 1 capsule by mouth daily.     ASPIRIN LOW DOSE 81 MG tablet TAKE 1 TABLET(81 MG) BY MOUTH DAILY.  SWALLOW WHOLE 30 tablet 11   ergocalciferol (VITAMIN D2) 1.25 MG (50000 UT) capsule Take 50,000 Units by mouth once a week.     Multiple Vitamin (MULTIVITAMIN WITH MINERALS) TABS tablet Take 1 tablet by mouth daily.     pantoprazole (PROTONIX) 40 MG tablet Take 1 tablet (40 mg total) by mouth daily. 30 tablet 1   rosuvastatin (CRESTOR) 20 MG tablet Take 20 mg by mouth daily.     No facility-administered medications prior to visit.    PAST MEDICAL HISTORY: Past Medical History:  Diagnosis Date   GERD (gastroesophageal reflux disease)    no med, diet controlled   Hyperlipidemia    Hypertension    Inguinal hernia    rt   Sleep apnea     "mild" - Does wear  CPAP   Sleep apnea, obstructive    Mild - Does have CPAP    PAST SURGICAL HISTORY: Past Surgical History:  Procedure Laterality Date   COLONOSCOPY     HERNIA REPAIR     POLYPECTOMY     UPPER GASTROINTESTINAL ENDOSCOPY      FAMILY HISTORY: Family History  Problem Relation Age of Onset   Heart disease Mother    Hypertension Mother    Hypertension Father    Colon cancer Neg Hx    Esophageal cancer Neg Hx    Rectal cancer Neg Hx    Stomach cancer Neg Hx    Colon polyps Neg Hx     SOCIAL HISTORY: Social History   Socioeconomic History   Marital status: Single    Spouse name: Not on file   Number of children: Not on file   Years of education: Not on file   Highest education level: Not on file  Occupational History   Not on file  Tobacco Use   Smoking status: Former    Packs/day: 0.50    Years: 27.00    Total pack years: 13.50    Types: Cigarettes    Quit date: 07/06/2012    Years since quitting: 9.3   Smokeless tobacco: Never  Vaping Use   Vaping Use: Every day  Substance and Sexual Activity   Alcohol use: No   Drug use: No   Sexual activity: Not on file  Other Topics Concern   Not on file  Social History Narrative   Denies caffeine use.   Social Determinants of Health   Financial Resource Strain: Not on file  Food Insecurity: Not on file  Transportation Needs: Not on file  Physical Activity: Not on file  Stress: Not on file  Social Connections: Not on file  Intimate Partner Violence: Not on file     PHYSICAL EXAM  There were no vitals filed for this visit. There is no height or weight on file to calculate BMI.  Generalized: Well developed, in no acute distress  Cardiology: normal rate and rhythm, no murmur noted Respiratory: clear to auscultation bilaterally  Neurological examination  Mentation: Alert oriented to time, place, history taking. Follows all commands speech and language fluent Cranial nerve II-XII: Pupils were  equal round reactive to light. Extraocular movements were full, visual field were full  Motor: The motor testing reveals 5 over 5 strength of all 4 extremities. Good symmetric motor tone is noted throughout.  Gait and station: Gait is normal.    DIAGNOSTIC DATA (LABS, IMAGING, TESTING) - I reviewed patient records, labs, notes, testing and imaging myself where available.      No data to display  Lab Results  Component Value Date   WBC 5.6 02/16/2019   HGB 13.6 02/16/2019   HCT 40.7 02/16/2019   MCV 87.6 02/16/2019   PLT 327.0 02/16/2019      Component Value Date/Time   NA 138 11/14/2020 1156   K 4.1 11/14/2020 1156   CL 101 11/14/2020 1156   CO2 23 11/14/2020 1156   GLUCOSE 115 (H) 11/14/2020 1156   GLUCOSE 100 (H) 04/27/2015 1642   BUN 16 11/14/2020 1156   CREATININE 1.04 11/14/2020 1156   CALCIUM 9.7 11/14/2020 1156   GFRNONAA >60 04/27/2015 1642   GFRAA >60 04/27/2015 1642   No results found for: "CHOL", "HDL", "LDLCALC", "LDLDIRECT", "TRIG", "CHOLHDL" No results found for: "HGBA1C" No results found for: "VITAMINB12" No results found for: "TSH"   ASSESSMENT AND PLAN 56 y.o. year old male  has a past medical history of GERD (gastroesophageal reflux disease), Hyperlipidemia, Hypertension, Inguinal hernia, Sleep apnea, and Sleep apnea, obstructive. here with   No diagnosis found.    Gregory Sanchez is doing well on CPAP therapy. Compliance report reveals ***. *** was encouraged to continue using CPAP nightly and for greater than 4 hours each night. We will update supply orders as indicated. Risks of untreated sleep apnea review and education materials provided. Healthy lifestyle habits encouraged. *** will follow up in ***, sooner if needed. *** verbalizes understanding and agreement with this plan.    No orders of the defined types were placed in this encounter.    No orders of the defined types were placed in this encounter.     Debbora Presto,  FNP-C 11/26/2021, 4:38 PM [facility] [address], [place], [place] [address] [phone]

## 2021-11-26 NOTE — Patient Instructions (Incomplete)
Please continue using your CPAP regularly. While your insurance requires that you use CPAP at least 4 hours each night on 70% of the nights, I recommend, that you not skip any nights and use it throughout the night if you can. Getting used to CPAP and staying with the treatment long term does take time and patience and discipline. Untreated obstructive sleep apnea when it is moderate to severe can have an adverse impact on cardiovascular health and raise her risk for heart disease, arrhythmias, hypertension, congestive heart failure, stroke and diabetes. Untreated obstructive sleep apnea causes sleep disruption, nonrestorative sleep, and sleep deprivation. This can have an impact on your day to day functioning and cause daytime sleepiness and impairment of cognitive function, memory loss, mood disturbance, and problems focussing. Using CPAP regularly can improve these symptoms.  Work on consistent use. I will lower maximum pressure from 16 to 12cmH20. Try to focus on starting therapy every night as soon as you feel sleepy. DOT requires a minimum of 80% compliance with daily and 4 hour use.   Follow up in 6 months

## 2021-11-27 ENCOUNTER — Encounter: Payer: Self-pay | Admitting: Family Medicine

## 2021-11-27 ENCOUNTER — Ambulatory Visit: Payer: BC Managed Care – PPO | Admitting: Family Medicine

## 2021-11-27 VITALS — BP 128/75 | HR 77 | Ht 71.0 in | Wt 210.6 lb

## 2021-11-27 DIAGNOSIS — Z9989 Dependence on other enabling machines and devices: Secondary | ICD-10-CM

## 2021-11-27 DIAGNOSIS — G4733 Obstructive sleep apnea (adult) (pediatric): Secondary | ICD-10-CM

## 2021-11-29 ENCOUNTER — Telehealth: Payer: Self-pay | Admitting: Neurology

## 2021-12-07 ENCOUNTER — Ambulatory Visit: Payer: BC Managed Care – PPO | Admitting: Cardiology

## 2021-12-07 ENCOUNTER — Encounter: Payer: Self-pay | Admitting: Cardiology

## 2021-12-07 VITALS — BP 113/69 | HR 68 | Temp 98.3°F | Resp 16 | Ht 71.0 in | Wt 210.8 lb

## 2021-12-07 DIAGNOSIS — I700 Atherosclerosis of aorta: Secondary | ICD-10-CM

## 2021-12-07 DIAGNOSIS — I2510 Atherosclerotic heart disease of native coronary artery without angina pectoris: Secondary | ICD-10-CM

## 2021-12-07 DIAGNOSIS — I10 Essential (primary) hypertension: Secondary | ICD-10-CM

## 2021-12-07 DIAGNOSIS — Z87891 Personal history of nicotine dependence: Secondary | ICD-10-CM

## 2021-12-07 DIAGNOSIS — G4733 Obstructive sleep apnea (adult) (pediatric): Secondary | ICD-10-CM

## 2021-12-07 NOTE — Progress Notes (Signed)
Date:  12/07/2021   ID:  Gregory Sanchez, DOB [birth year], MRN [id number]  PCP:  Ginger Organ., MD  Cardiologist:  Rex Kras, DO, [facility] (established care 10/30/2020)  Date: 12/07/21 Last Office Visit: 11/23/2020  Chief Complaint  Patient presents with   Coronary artery calcification   Follow-up    HPI  Gregory Sanchez is a 56 y.o. male whose past medical history and cardiovascular risk factors include: Moderate coronary artery calcification (CAC 215 AU, 95th percentile 11/2020), aortic atherosclerosis, benign essential hypertension, erectile dysfunction, mild OSA currently on CPAP, former smoker.  In the past patient was referred to the practice for evaluation of precordial pain.  He underwent ischemic work-up including a coronary CTA which noted a total CAC of 215 placing him at the 95th percentile and minimal nonobstructive CAD in the LAD distribution.  He presents today for 1 year follow-up visit.  Over the last 1 year patient has not had any anginal discomfort or heart failure symptoms.  He denies any hospitalizations or urgent care visits for cardiovascular symptoms.  He recently had labs with PCP we will request records.  ALLERGIES: Allergies  Allergen Reactions   Penicillins Hives    Has patient had a PCN reaction causing immediate rash, facial/tongue/throat swelling, SOB or lightheadedness with hypotension: No Has patient had a PCN reaction causing severe rash involving mucus membranes or skin necrosis: No Has patient had a PCN reaction that required hospitalization No Has patient had a PCN reaction occurring within the last 10 years: Yes If all of the above answers are "NO", then may proceed with Cephalosporin use.    MEDICATION LIST PRIOR TO VISIT: Current Meds  Medication Sig   amLODipine-benazepril (LOTREL) 10-40 MG capsule Take 1 capsule by mouth daily.   ASPIRIN LOW DOSE 81 MG tablet TAKE 1 TABLET(81 MG) BY MOUTH DAILY. SWALLOW WHOLE   ergocalciferol  (VITAMIN D2) 1.25 MG (50000 UT) capsule Take 50,000 Units by mouth once a week.   Multiple Vitamin (MULTIVITAMIN WITH MINERALS) TABS tablet Take 1 tablet by mouth daily.   pantoprazole (PROTONIX) 40 MG tablet Take 1 tablet (40 mg total) by mouth daily.   rosuvastatin (CRESTOR) 20 MG tablet Take 20 mg by mouth daily.     PAST MEDICAL HISTORY: Past Medical History:  Diagnosis Date   GERD (gastroesophageal reflux disease)    no med, diet controlled   Hyperlipidemia    Hypertension    Inguinal hernia    rt   Sleep apnea    "mild" - Does wear  CPAP   Sleep apnea, obstructive    Mild - Does have CPAP    PAST SURGICAL HISTORY: Past Surgical History:  Procedure Laterality Date   COLONOSCOPY     HERNIA REPAIR     POLYPECTOMY     UPPER GASTROINTESTINAL ENDOSCOPY      FAMILY HISTORY: The patient family history includes Heart disease in his mother; Hypertension in his father and mother.  SOCIAL HISTORY:  The patient  reports that he quit smoking about 9 years ago. His smoking use included cigarettes. He has a 13.50 pack-year smoking history. He has never used smokeless tobacco. He reports that he does not drink alcohol and does not use drugs.  REVIEW OF SYSTEMS: Review of Systems  Constitutional: Negative for chills and fever.  HENT:  Negative for hoarse voice and nosebleeds.   Eyes:  Negative for discharge, double vision and pain.  Cardiovascular:  Negative for chest pain, claudication, dyspnea on  exertion, leg swelling, near-syncope, orthopnea, palpitations, paroxysmal nocturnal dyspnea and syncope.  Respiratory:  Negative for hemoptysis and shortness of breath.   Musculoskeletal:  Negative for muscle cramps and myalgias.  Gastrointestinal:  Negative for abdominal pain, constipation, diarrhea, hematemesis, hematochezia, melena, nausea and vomiting.  Neurological:  Negative for dizziness and light-headedness.    PHYSICAL EXAM:    12/07/2021   12:55 PM 11/27/2021    3:08 PM  04/11/2021    2:25 PM  Vitals with BMI  Height '5\' 11"'$  '5\' 11"'$    Weight 210 lbs 13 oz 210 lbs 10 oz   BMI 28.41 32.44   Systolic 010 272 536  Diastolic 69 75 72  Pulse 68 77 59   Physical Exam  Constitutional: No distress.  Age appropriate, hemodynamically stable.   Neck: No JVD present.  Cardiovascular: Normal rate, regular rhythm, S1 normal, S2 normal, intact distal pulses and normal pulses. Exam reveals no gallop, no S3 and no S4.  No murmur heard. Pulmonary/Chest: Effort normal and breath sounds normal. No stridor. He has no wheezes. He has no rales.  Abdominal: Soft. Bowel sounds are normal. He exhibits no distension. There is no abdominal tenderness.  Musculoskeletal:        General: No edema.     Cervical back: Neck supple.  Neurological: He is alert and oriented to person, place, and time. He has intact cranial nerves (2-12).  Skin: Skin is warm and moist.   CARDIAC DATABASE: EKG: 12/07/2021: Normal sinus rhythm, 60 bpm, normal axis, without underlying ischemia injury pattern.  Echocardiogram: 11/09/2020: Normal LV systolic function with visual EF 60-65%. Left ventricle cavity is normal in size. Normal left ventricular wall thickness. Normal global wall motion. Normal diastolic filling pattern, normal LAP. Left atrial cavity is normal in size. A lipomatous septum is present. Mild tricuspid regurgitation. No evidence of pulmonary hypertension. No prior study for comparison.   Stress Testing: Exercise nuclear stress test: 05/05/2015: ? The left ventricular ejection fraction is normal (55-65%). ? Nuclear stress EF: 59%. ? Blood pressure demonstrated a hypertensive response to exercise. ? There was no ST segment deviation noted during stress. ? The study is normal. ? This is a low risk study.  CCTA 11/16/2020: 1. Total coronary calcium score of 215. This was 95th percentile for age and sex matched control. 2. Normal coronary origin with right dominance. 3. CAD-RADS = 1  Minimal non-obstructive CAD. LM: Patent LAD: Minimal stenosis (1-24%) proximal LAD due to calcified plaque. Mid to distal LAD is patent. LCX /OM1: patent with no evidence of plaque or stenosis. RCA: minimal diffuse irregularities due to calcified plaque. Mid and distal RCA overall patent but due to artifact the accuracy is reduced. 4. No significant incidental findings. Stable right lower lobe scarring.  Heart Catheterization: None   LABORATORY DATA: External labs: 12/02/2019 provided by PCP. AST 27, ALT 73, alkaline phosphatase 77  10/29/2019: Sodium 141, potassium 4.2, chloride 104, bicarb 19, BUN 15, creatinine 1.1 Hemoglobin 13.5 g/dL, hematocrit 40.7 Total cholesterol 88, triglycerides 74, HDL 39, LDL 34, non-HDL 49.  TSH 1.24  IMPRESSION:    ICD-10-CM   1. Coronary atherosclerosis due to calcified coronary lesion  I25.10 EKG 12-Lead   I25.84     2. Atherosclerosis of aorta (HCC)  I70.0     3. Benign hypertension  I10     4. OSA on CPAP  G47.33    Z99.89     5. Former smoker  [phone]  RECOMMENDATIONS: USHER HEDBERG is a 56 y.o. male whose past medical history and cardiac risk factors include: Moderate coronary artery calcification (CAC 215 AU, 95th percentile 11/2020), aortic atherosclerosis, benign essential hypertension, erectile dysfunction, mild OSA currently on CPAP, former smoker.  Coronary atherosclerosis due to calcified coronary lesion / Atherosclerosis of aorta (HCC) Continue aspirin and statin therapy. Denies anginal discomfort. Recent ischemic work-up as outlined above includes echo, coronary CTA. EKG: Nonischemic. Reemphasized the importance of blood pressure management, lipid management, glycemic control. Encouraged the importance of moderate intensity exercise as tolerated with a goal of 30 minutes a day 5 days a week.  Benign hypertension Office blood pressures are well controlled. No changes warranted at this time. Reemphasized the  importance of a low-salt diet.  OSA on CPAP Re encouraged the importance of compliance.   After the office visit received outside records. External Labs: Collected: 12/05/2021 Sodium 141, potassium 4.1, chloride 105, bicarb 25 BUN 12, creatinine 0.9. AST 22, ALT 46, alkaline phosphatase 72. Hemoglobin 13.5 g/dL, hematocrit 37.4% Total cholesterol 106, triglycerides 56, HDL 52, LDL 43, non-HDL 54. TSH 0.99 A1c 6.1  Shared decision was to follow outpatient every 2 years or sooner if change in clinical status.  FINAL MEDICATION LIST END OF ENCOUNTER: No orders of the defined types were placed in this encounter.   There are no discontinued medications.    Current Outpatient Medications:    amLODipine-benazepril (LOTREL) 10-40 MG capsule, Take 1 capsule by mouth daily., Disp: , Rfl:    ASPIRIN LOW DOSE 81 MG tablet, TAKE 1 TABLET(81 MG) BY MOUTH DAILY. SWALLOW WHOLE, Disp: 30 tablet, Rfl: 11   ergocalciferol (VITAMIN D2) 1.25 MG (50000 UT) capsule, Take 50,000 Units by mouth once a week., Disp: , Rfl:    Multiple Vitamin (MULTIVITAMIN WITH MINERALS) TABS tablet, Take 1 tablet by mouth daily., Disp: , Rfl:    pantoprazole (PROTONIX) 40 MG tablet, Take 1 tablet (40 mg total) by mouth daily., Disp: 30 tablet, Rfl: 1   rosuvastatin (CRESTOR) 20 MG tablet, Take 20 mg by mouth daily., Disp: , Rfl:   Orders Placed This Encounter  Procedures   EKG 12-Lead    There are no Patient Instructions on file for this visit.   --Continue cardiac medications as reconciled in final medication list. --Return in about 2 years (around 12/08/2023) for Follow up, Coronary artery calcification. Or sooner if needed. --Continue follow-up with your primary care physician regarding the management of your other chronic comorbid conditions.  Patient's questions and concerns were addressed to his satisfaction. He voices understanding of the instructions provided during this encounter.   This note was created  using a voice recognition software as a result there may be grammatical errors inadvertently enclosed that do not reflect the nature of this encounter. Every attempt is made to correct such errors.  Rex Kras, [place], [facility]  Pager: [phone] Office: [phone]

## 2021-12-19 ENCOUNTER — Other Ambulatory Visit: Payer: Self-pay

## 2021-12-19 DIAGNOSIS — R072 Precordial pain: Secondary | ICD-10-CM

## 2021-12-19 MED ORDER — ASPIRIN 81 MG PO TBEC: DELAYED_RELEASE_TABLET | ORAL | 11 refills | Status: DC

## 2022-01-17 ENCOUNTER — Telehealth: Payer: Self-pay | Admitting: Family Medicine

## 2022-01-17 IMAGING — US US ABDOMEN LIMITED
1 series · 14 of 25 positions shown · non-contrast
Comparison: Abdominal ultrasound February 22, 2019

CLINICAL DATA: Elevated liver enzymes

EXAM:
ULTRASOUND ABDOMEN LIMITED RIGHT UPPER QUADRANT

[Series 1: us abdomen limited · 0.20mm/px · 14 of 46 slices shown]
[im 1/46]
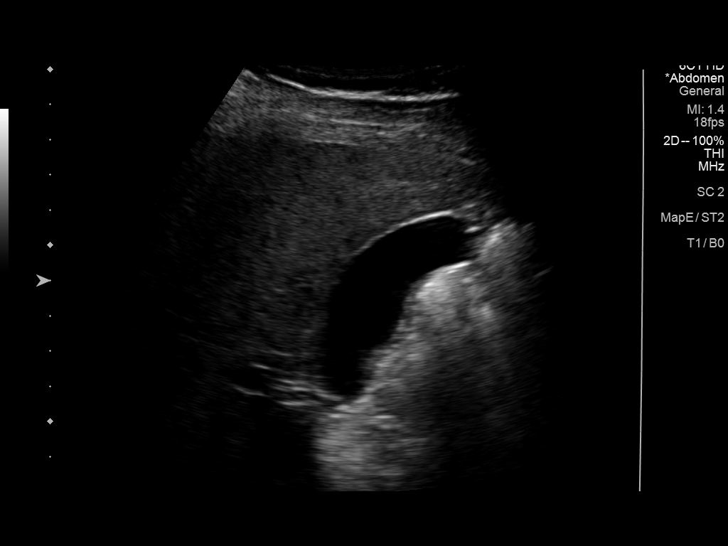
[im 4/46]
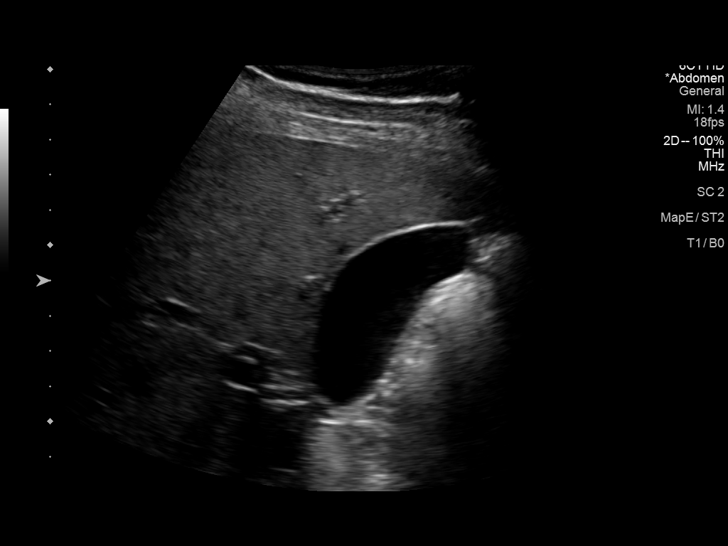
[im 8/46]
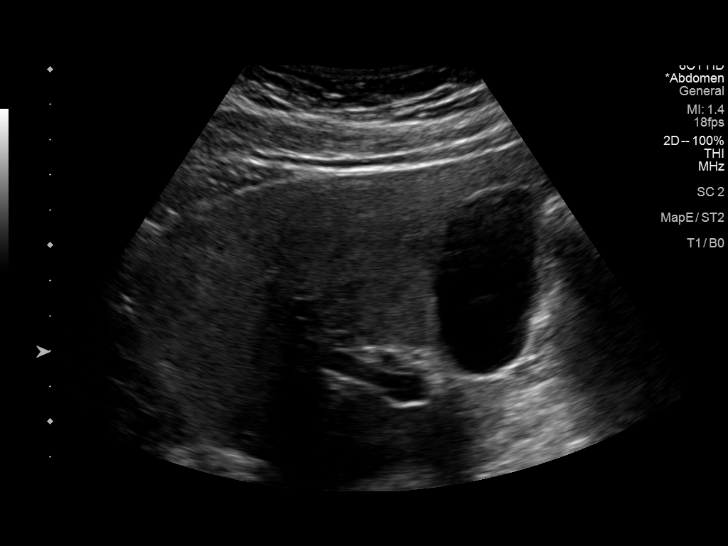
[im 12/46]
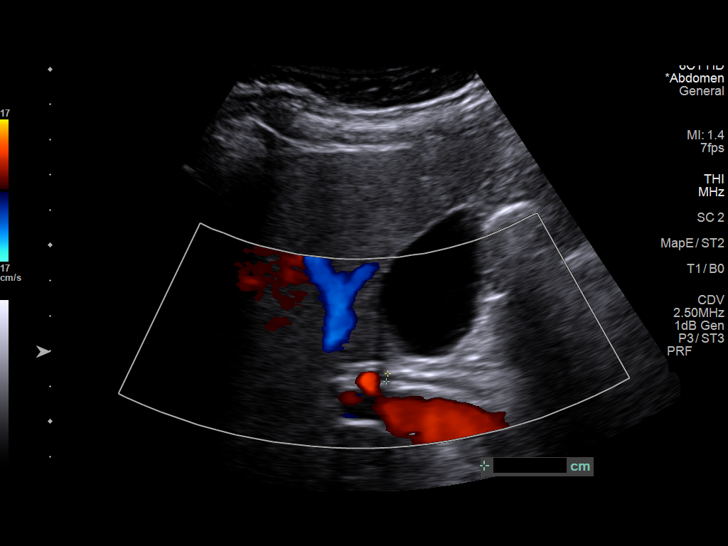
[im 16/46]
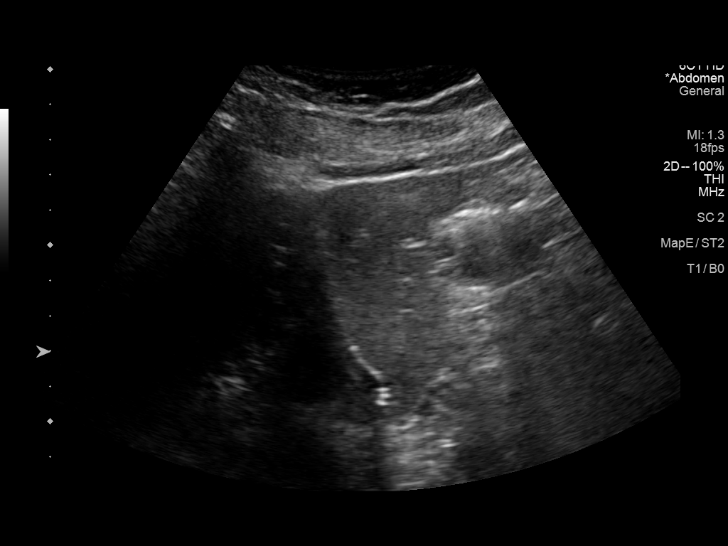
[im 17/46]
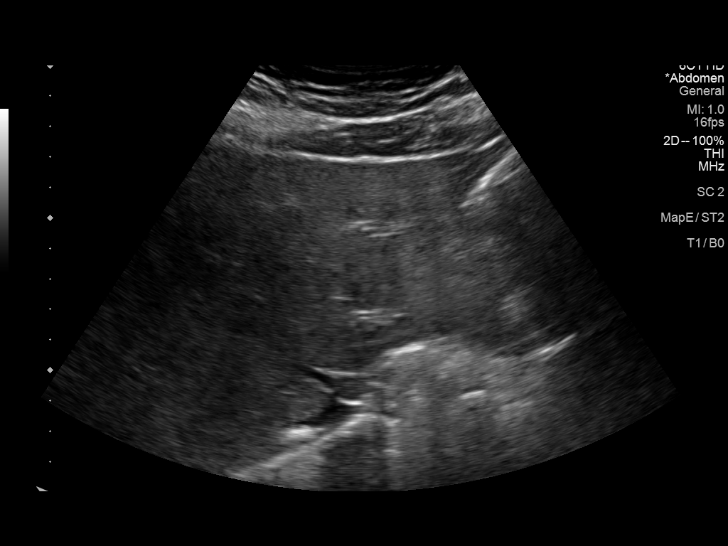
[im 21/46]
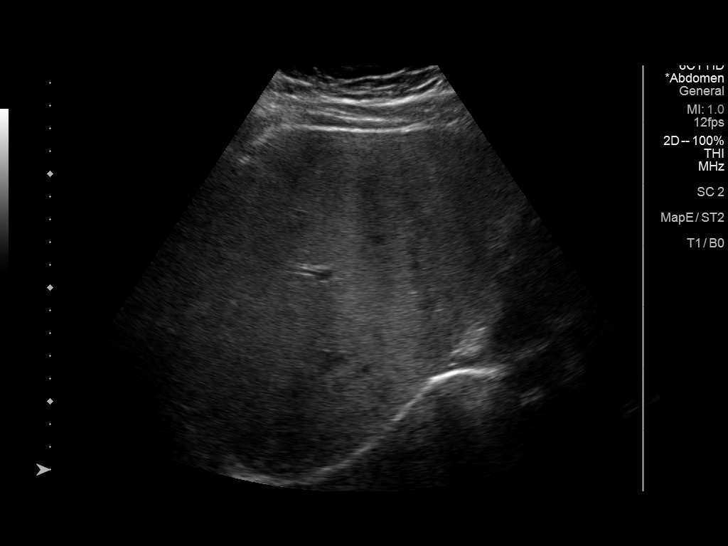
[im 25/46]
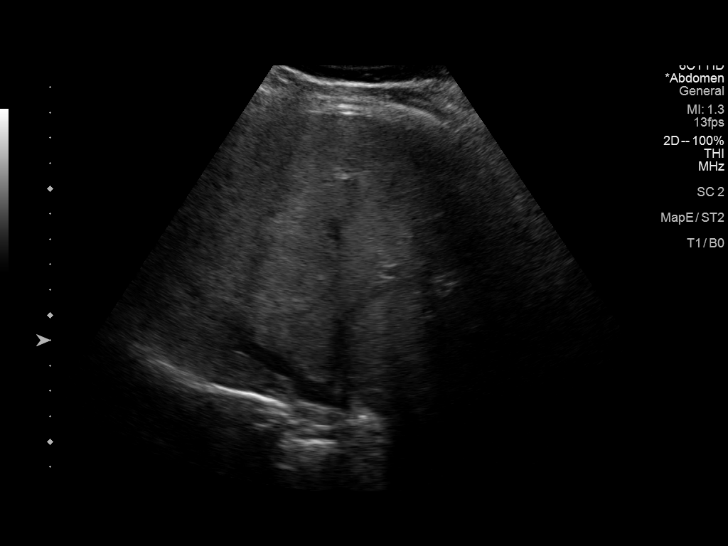
[im 29/46]
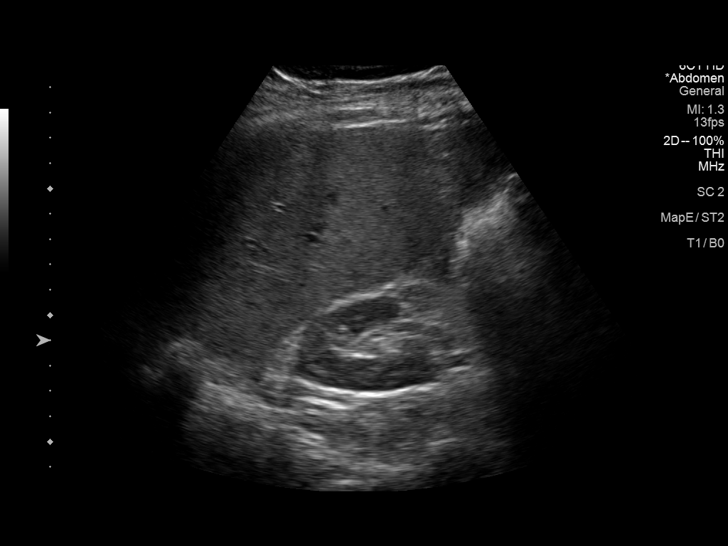
[im 31/46]
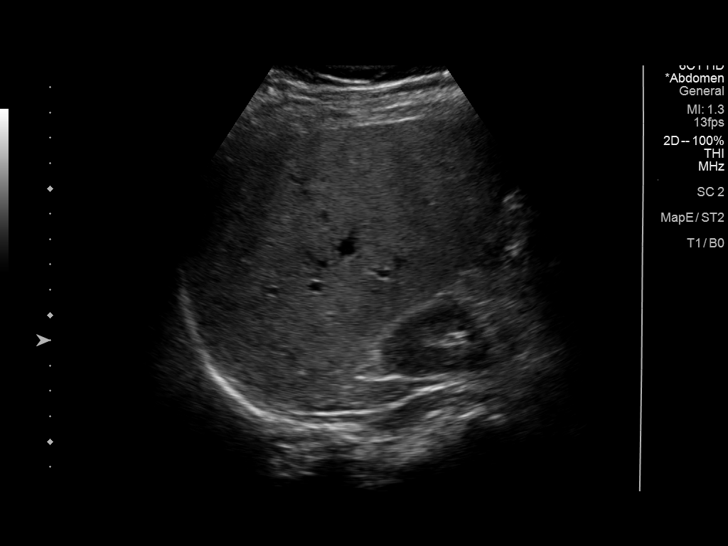
[im 34/46]
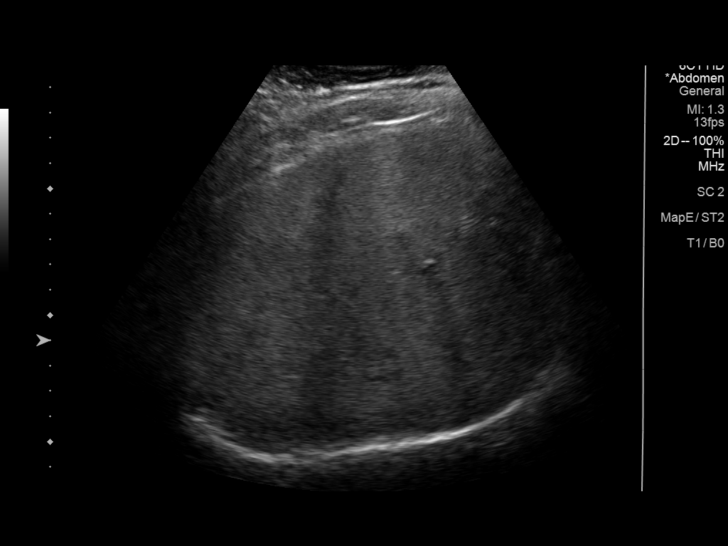
[im 38/46]
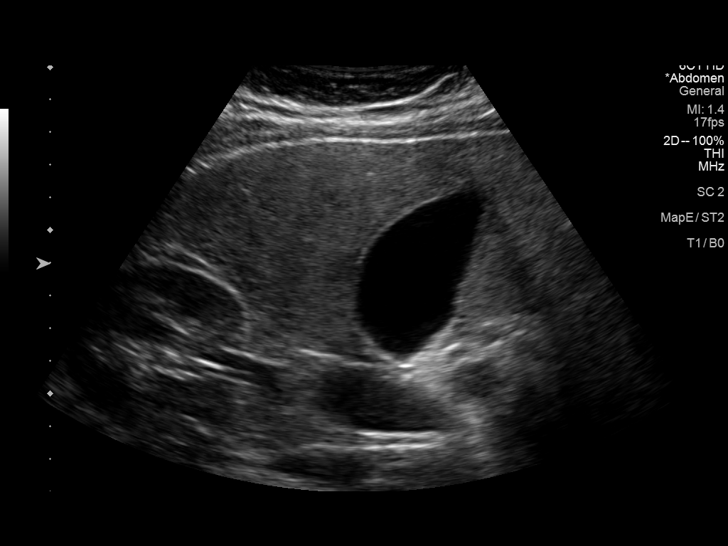
[im 42/46]
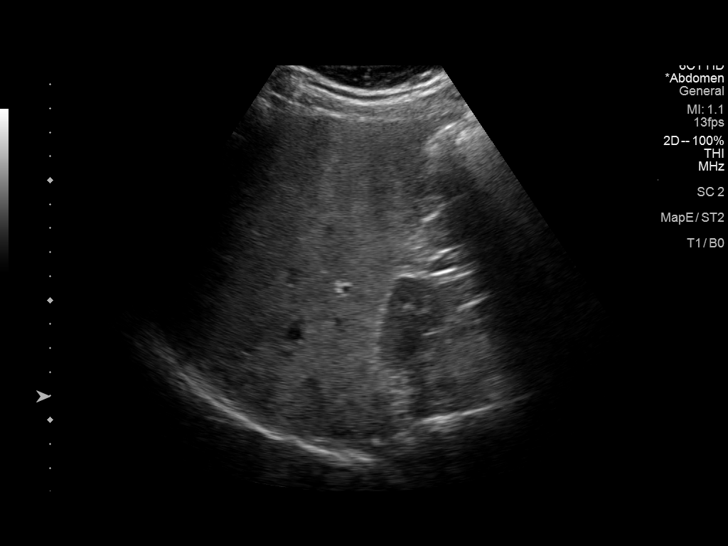
[im 46/46]
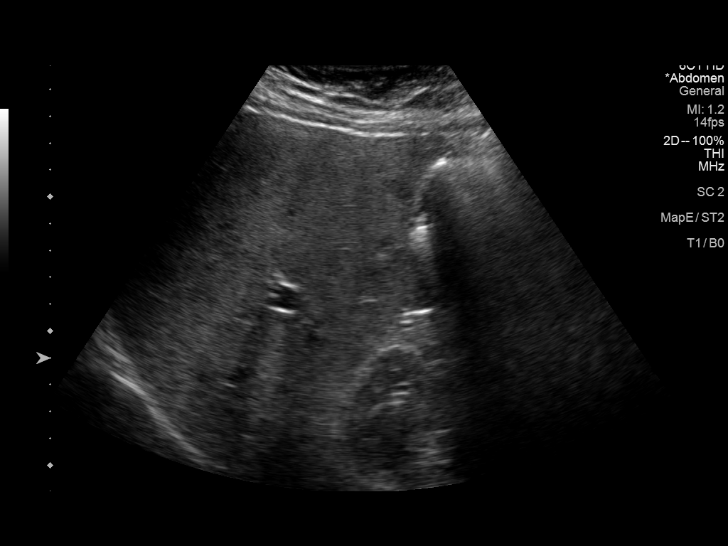

[14 of 25 positions shown; findings below may reference images not displayed]

FINDINGS: Gallbladder:

No gallstones or wall thickening visualized. There is no
pericholecystic fluid. No sonographic Murphy sign noted by
sonographer.

Common bile duct:

Diameter: 2 mm. No intrahepatic or extrahepatic biliary duct
dilatation.

Liver:

No focal lesion identified. Liver echogenicity overall increased.
Portal vein is patent on color Doppler imaging with normal direction
of blood flow towards the liver.

Other: None.
IMPRESSION: Increased liver echogenicity is likely indicative of a degree of
hepatic steatosis. No focal liver lesions evident. Note that the
sensitivity of ultrasound for detection of focal liver lesions is
somewhat diminished in this circumstance. Study otherwise
unremarkable.

## 2022-01-17 NOTE — Telephone Encounter (Signed)
Called the patient. Advised Amy wanted [place] to check in. We are unable to see up to date data on the machine. She wanted me to reach out and check on him. Pt states overall he feels that things are better. Advised that if he is able to drop the machine off we can do a quick DL on the machine to make sure that it is working well and treating apnea effectively. Pt verbalized understanding. He was appreciative for the call

## 2022-01-17 NOTE — Telephone Encounter (Signed)
Please check that max pressure setting was reduced from 16 to 12. Current report still shows 6-16. Let him know that I reviewed data and compliance is very low. 11% over last 2 months. Any concerns he is having that we can help with?

## 2022-01-17 NOTE — Telephone Encounter (Signed)
Pt's machine is a card DL machine. The current data is not up to date. Last time downloaded was 11/26/2021. Would you like for me to schedule a follow up apt and have him bring machine in to get an updated DL?

## 2022-01-22 ENCOUNTER — Telehealth: Payer: Self-pay

## 2022-01-22 NOTE — Telephone Encounter (Signed)
Na

## 2022-02-14 ENCOUNTER — Other Ambulatory Visit: Payer: Self-pay

## 2022-02-14 DIAGNOSIS — R072 Precordial pain: Secondary | ICD-10-CM

## 2022-02-14 MED ORDER — ASPIRIN 81 MG PO TBEC: DELAYED_RELEASE_TABLET | ORAL | 11 refills | Status: AC

## 2022-05-27 ENCOUNTER — Telehealth: Payer: Self-pay | Admitting: *Deleted

## 2022-05-27 NOTE — Progress Notes (Unsigned)
PATIENT: Gregory Sanchez DOB: Jul 16, 1965  REASON FOR VISIT: follow up HISTORY FROM: patient  No chief complaint on file.    HISTORY OF PRESENT ILLNESS:  05/27/22 ALL:  Gregory Sanchez returns for follow up for OSA on CPAP. He was last seen 11/2021 and having difficulty tolerating pressure settings. We reduced max pressure from 16 to 12. Since,   11/27/2021 ALL: Gregory Sanchez is a 57 y.o. male here today for follow up for OSA on CPAP.  He reports doing well until around spring 2023. He states that he is able to start therapy at night and can get to sleep but then wakes up after about 4-5 hours feeling that pressure is too strong and that he can not breathe. He sometimes notes that his heart is racing. He denies dreaming or parasomnias. He admits that usage has fallen off quite a bit. He will lie down at night before starting therapy and fall asleep. 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 He does feel more tired and has more daytime sleepiness.     HISTORY: (copied from Dr Dohmeier's previous note)  He is now seen in a RV . [name] reports that he is still in the process of getting used to his machine he is using a APAP machine with a pressure variability between 6 and 16 cmH2O no EPR his 95th percentile pressure has been only 6.6 cm.  His 95th percentile air leak is low 2.6 L/min his residual AHI is 1.6 without central apneas emerging.  He has been 97% compliant over the last 30 days and by hours 80% compliant with an average of 4 hours and 50 minutes.  So looking through his hip milligram and compliance download there were some nights when the mask probably slipped and he therefore had a lot of air leakage but on general the mask fits him well and his AHI is excellently controlled I would like to remind that the patient was sent here by Dr. Delorse Limber as read his primary cardiologist and he had a first sleep study in 2016 and on 10-29-2014 had return for CPAP  titration.  This time he had complex mostly obstructive sleep apnea with an AHI of 9.3 that was supine dependent no periodic limb movement disorder and just snoring was noted.  So I recommended to try auto titration again the patient seems to have done extremely well he went through an in lab titration on [phone] under the guidance of she now feels respiratory technologist.  Starting at 5 cm water pressure he was titrated up to 16 when he still had some apnea.  He was then switched to BiPAP.  It appears that the sweet spot for his nocturnal titration was actually at 14 cmH2O, however.   I am happy to see that he is now doing excellent with only 6.6 cm need at night.  So I would offer him a sleep aid if he has trouble falling asleep with the CPAP.  The patient states that his wife has been very happy because he is no longer snoring so There has been some success here his Epworth sleepiness score was decreased to 12 points and his fatigue severity score was not endorsed today.  He sleeps more on his side now, which allowed for low pressure at 955.    REVIEW OF SYSTEMS: Out of a complete 14 system review of symptoms, the patient complains only of the following symptoms, fatigue and all  other reviewed systems are negative.  ESS: not completed today, previously 12/24    ALLERGIES: Allergies  Allergen Reactions   Penicillins Hives    Has patient had a PCN reaction causing immediate rash, facial/tongue/throat swelling, SOB or lightheadedness with hypotension: No Has patient had a PCN reaction causing severe rash involving mucus membranes or skin necrosis: No Has patient had a PCN reaction that required hospitalization No Has patient had a PCN reaction occurring within the last 10 years: Yes If all of the above answers are "NO", then may proceed with Cephalosporin use.    HOME MEDICATIONS: Outpatient Medications Prior to Visit  Medication Sig Dispense Refill   amLODipine-benazepril (LOTREL) 10-40  MG capsule Take 1 capsule by mouth daily.     aspirin EC (ASPIRIN LOW DOSE) 81 MG tablet TAKE 1 TABLET(81 MG) BY MOUTH DAILY. SWALLOW WHOLE 30 tablet 11   ergocalciferol (VITAMIN D2) 1.25 MG (50000 UT) capsule Take 50,000 Units by mouth once a week.     Multiple Vitamin (MULTIVITAMIN WITH MINERALS) TABS tablet Take 1 tablet by mouth daily.     pantoprazole (PROTONIX) 40 MG tablet Take 1 tablet (40 mg total) by mouth daily. 30 tablet 1   rosuvastatin (CRESTOR) 20 MG tablet Take 20 mg by mouth daily.     No facility-administered medications prior to visit.    PAST MEDICAL HISTORY: Past Medical History:  Diagnosis Date   GERD (gastroesophageal reflux disease)    no med, diet controlled   Hyperlipidemia    Hypertension    Inguinal hernia    rt   Sleep apnea    "mild" - Does wear  CPAP   Sleep apnea, obstructive    Mild - Does have CPAP    PAST SURGICAL HISTORY: Past Surgical History:  Procedure Laterality Date   COLONOSCOPY     HERNIA REPAIR     POLYPECTOMY     UPPER GASTROINTESTINAL ENDOSCOPY      FAMILY HISTORY: Family History  Problem Relation Age of Onset   Heart disease Mother    Hypertension Mother    Hypertension Father    Colon cancer Neg Hx    Esophageal cancer Neg Hx    Rectal cancer Neg Hx    Stomach cancer Neg Hx    Colon polyps Neg Hx     SOCIAL HISTORY: Social History   Socioeconomic History   Marital status: Single    Spouse name: Not on file   Number of children: Not on file   Years of education: Not on file   Highest education level: Not on file  Occupational History   Not on file  Tobacco Use   Smoking status: Former    Packs/day: 0.50    Years: 27.00    Total pack years: 13.50    Types: Cigarettes    Quit date: 07/06/2012    Years since quitting: 9.8   Smokeless tobacco: Never  Vaping Use   Vaping Use: Every day  Substance and Sexual Activity   Alcohol use: No   Drug use: No   Sexual activity: Not on file  Other Topics Concern    Not on file  Social History Narrative   Denies caffeine use.   Social Determinants of Health   Financial Resource Strain: Not on file  Food Insecurity: Not on file  Transportation Needs: Not on file  Physical Activity: Not on file  Stress: Not on file  Social Connections: Not on file  Intimate Partner Violence: Not on file  PHYSICAL EXAM  There were no vitals filed for this visit.  There is no height or weight on file to calculate BMI.  Generalized: Well developed, in no acute distress  Cardiology: normal rate and rhythm, no murmur noted Respiratory: clear to auscultation bilaterally  Neurological examination  Mentation: Alert oriented to time, place, history taking. Follows all commands speech and language fluent Cranial nerve II-XII: Pupils were equal round reactive to light. Extraocular movements were full, visual field were full  Motor: The motor testing reveals 5 over 5 strength of all 4 extremities. Good symmetric motor tone is noted throughout.  Gait and station: Gait is normal.    DIAGNOSTIC DATA (LABS, IMAGING, TESTING) - I reviewed patient records, labs, notes, testing and imaging myself where available.      No data to display           Lab Results  Component Value Date   WBC 5.6 02/16/2019   HGB 13.6 02/16/2019   HCT 40.7 02/16/2019   MCV 87.6 02/16/2019   PLT 327.0 02/16/2019      Component Value Date/Time   NA 138 11/14/2020 1156   K 4.1 11/14/2020 1156   CL 101 11/14/2020 1156   CO2 23 11/14/2020 1156   GLUCOSE 115 (H) 11/14/2020 1156   GLUCOSE 100 (H) 04/27/2015 1642   BUN 16 11/14/2020 1156   CREATININE 1.04 11/14/2020 1156   CALCIUM 9.7 11/14/2020 1156   GFRNONAA >60 04/27/2015 1642   GFRAA >60 04/27/2015 1642   No results found for: "CHOL", "HDL", "LDLCALC", "LDLDIRECT", "TRIG", "CHOLHDL" No results found for: "HGBA1C" No results found for: "VITAMINB12" No results found for: "TSH"   ASSESSMENT AND PLAN 57 y.o. year old male   has a past medical history of GERD (gastroesophageal reflux disease), Hyperlipidemia, Hypertension, Inguinal hernia, Sleep apnea, and Sleep apnea, obstructive. here with   No diagnosis found.   Doreatha Martin Midgley was previously doing well on CPAP therapy but reports having difficulty waking at night feeling that he can not breathe. Pressure in the 95th percentile of 6.6 with max pressure 8.4cmH20. I will lower autopap settings from 6-16 to 6-12cmH20 to see if this helps. We have reviewed compliance report revealing sub optimal usage. He was encouraged to continue using CPAP nightly and for greater than 4 hours each night. I will reassess download in 6-8 weeks to assess response and to review compliance. We will update supply orders as indicated. Risks of untreated sleep apnea review and education materials provided. Healthy lifestyle habits encouraged. He will follow up in 6 months, sooner if needed. He verbalizes understanding and agreement with this plan.    No orders of the defined types were placed in this encounter.    No orders of the defined types were placed in this encounter.     Debbora Presto, FNP-C 05/27/2022, 9:06 AM [facility] [address] [place], [place] [address] [phone]

## 2022-05-27 NOTE — Patient Instructions (Incomplete)
Please continue using your CPAP regularly. While your insurance requires that you use CPAP at least 4 hours each night on 70% of the nights, I recommend, that you not skip any nights and use it throughout the night if you can. Getting used to CPAP and staying with the treatment long term does take time and patience and discipline. Untreated obstructive sleep apnea when it is moderate to severe can have an adverse impact on cardiovascular health and raise her risk for heart disease, arrhythmias, hypertension, congestive heart failure, stroke and diabetes. Untreated obstructive sleep apnea causes sleep disruption, nonrestorative sleep, and sleep deprivation. This can have an impact on your day to day functioning and cause daytime sleepiness and impairment of cognitive function, memory loss, mood disturbance, and problems focussing. Using CPAP regularly can improve these symptoms.  Follow up in 6 months.  

## 2022-05-27 NOTE — Telephone Encounter (Signed)
Called patient asked him to bring cpap machine and cord for visit on 05/28/22. Pt verbalized he understood.

## 2022-05-28 ENCOUNTER — Encounter: Payer: Self-pay | Admitting: Family Medicine

## 2022-05-28 ENCOUNTER — Ambulatory Visit: Payer: BC Managed Care – PPO | Admitting: Family Medicine

## 2022-05-28 VITALS — BP 111/69 | HR 78 | Ht 71.0 in | Wt 214.5 lb

## 2022-05-28 DIAGNOSIS — G4733 Obstructive sleep apnea (adult) (pediatric): Secondary | ICD-10-CM | POA: Diagnosis not present

## 2022-10-16 ENCOUNTER — Telehealth: Payer: Self-pay | Admitting: Family Medicine

## 2022-10-16 NOTE — Telephone Encounter (Signed)
Unable to LVM, no VM box. Sent mychart msg informing pt of need to reschedule 12/04/22 appt - NP out

## 2022-11-27 ENCOUNTER — Telehealth: Payer: Self-pay | Admitting: Family Medicine

## 2022-11-27 NOTE — Telephone Encounter (Signed)
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

## 2022-11-27 NOTE — Telephone Encounter (Signed)
Needs to bring in ibreeze machine.

## 2022-11-27 NOTE — Telephone Encounter (Signed)
I called pt and relayed he needs to bring in his machine for u to DL information off ibreeze cpap.  He will do this tomorrow.  Should be able to give him report at that time as well. He verbalized understanding.

## 2022-11-28 NOTE — Telephone Encounter (Signed)
Pt has called to inform that he was only given a print out for [phone] EAVW'09 but not Aug.  Pt is asking that Aug'24 be printed he does not live that far away, he is willing to come back this afternoon for the print out, please call when it is available.

## 2022-11-28 NOTE — Telephone Encounter (Signed)
Printed and at the [employer] for pickup

## 2022-12-04 ENCOUNTER — Ambulatory Visit: Payer: BC Managed Care – PPO | Admitting: Family Medicine

## 2022-12-22 IMAGING — CT CT HEART MORP W/ CTA COR W/ SCORE W/ CA W/CM &/OR W/O CM
1 of 2 series · 8 of 20 positions shown, 10 images · IV contrast (omnipaque)
Comparison: CT of the chest without contrast on 10/19/2020
COMPARISON: CT of the chest without contrast on 10/19/2020

Addendum:
EXAM:
OVER-READ INTERPRETATION  CT CHEST

The following report is an over-read performed by radiologist Dr.
over-read does not include interpretation of cardiac or coronary
anatomy or pathology. The coronary CTA interpretation by the
cardiologist is attached.
HISTORY: Chest pain, nonspecific
Cardiac/Coronary  CT
TECHNIQUE: The patient was scanned on a Siemens Force scanner.
PROTOCOL: A 120 kV prospective scan was triggered in the descending thoracic
aorta at 111 HU's. Axial non-contrast 3 mm slices were carried out
through the heart. The data set was analyzed on a dedicated work
station and scored using the Agatson method. Gantry rotation speed
was 250 msecs and collimation was .6 mm. No IV beta blockade but
mg of sl NTG was given. The 3D data set was reconstructed in 5%
intervals of the 67-82 % of the R-R cycle. Diastolic phases were
analyzed on a dedicated work station using MPR, MIP and VRT modes.
The patient received 95mL OMNIPAQUE IOHEXOL 350 MG/ML SOLN of
contrast.

[Series 2023: — · 0.43mm/px · 8 of 11 slices shown, 10 images]
[im 2/11  vessel]
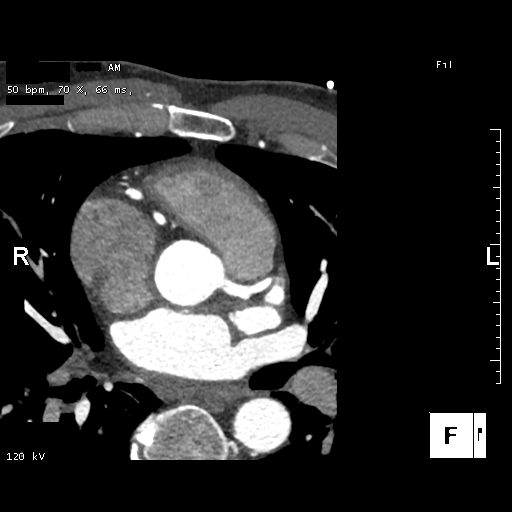
[im 2/11  lung]
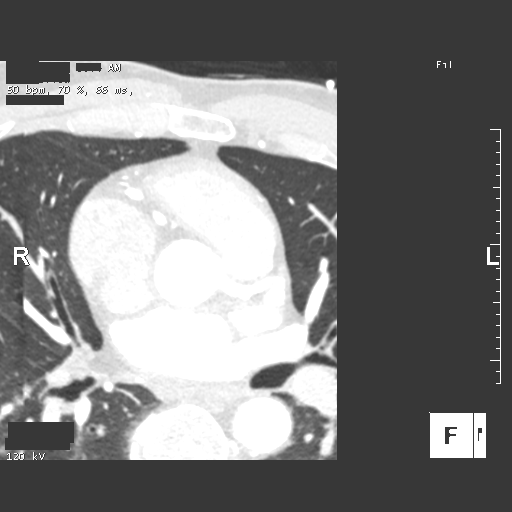
[im 3/11  vessel]
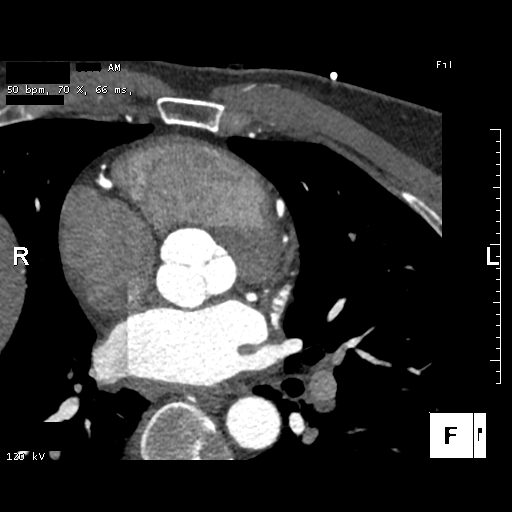
[im 4/11  vessel]
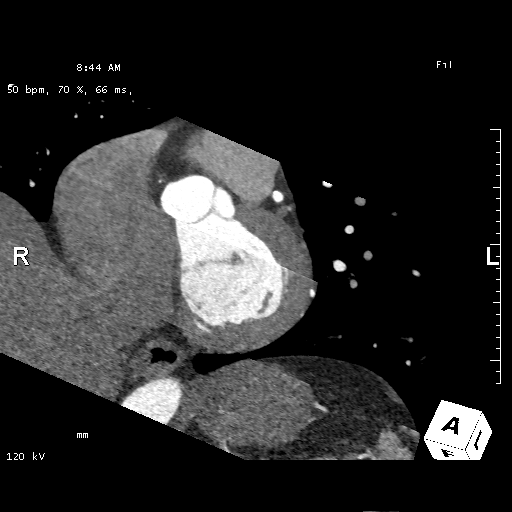
[im 5/11  vessel]
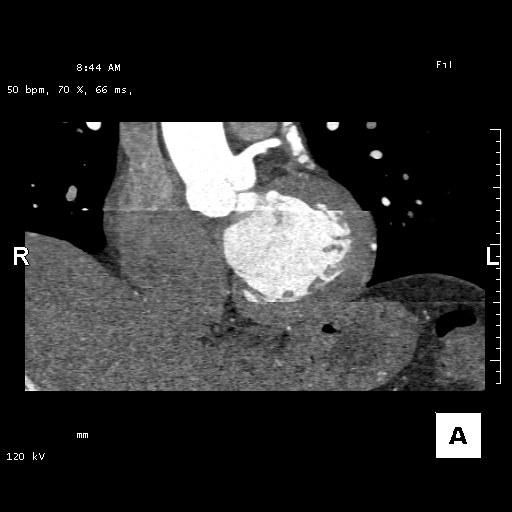
[im 6/11  vessel]
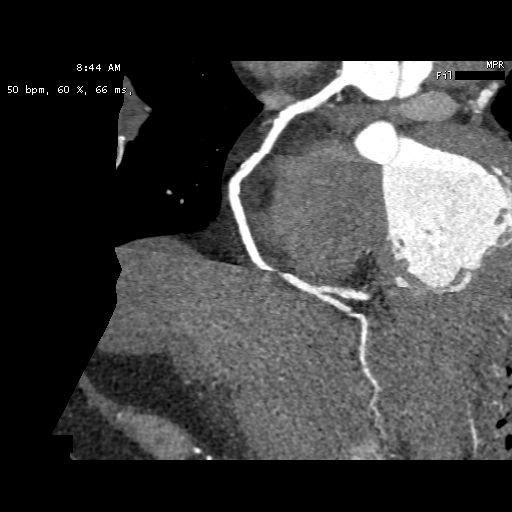
[im 6/11  lung]
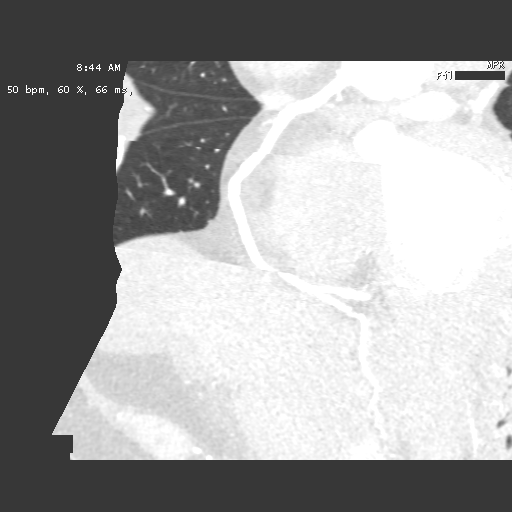
[im 7/11  vessel]
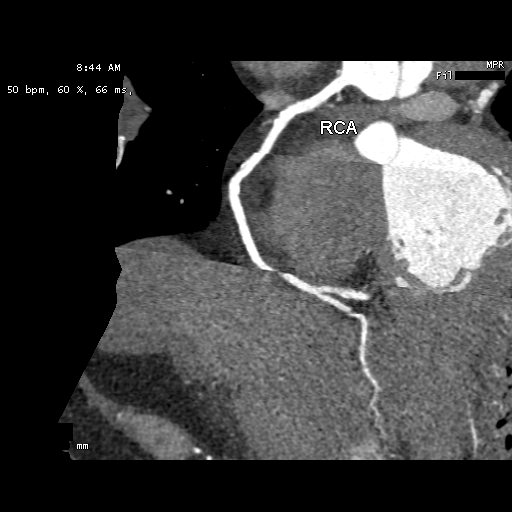
[im 8/11  vessel]
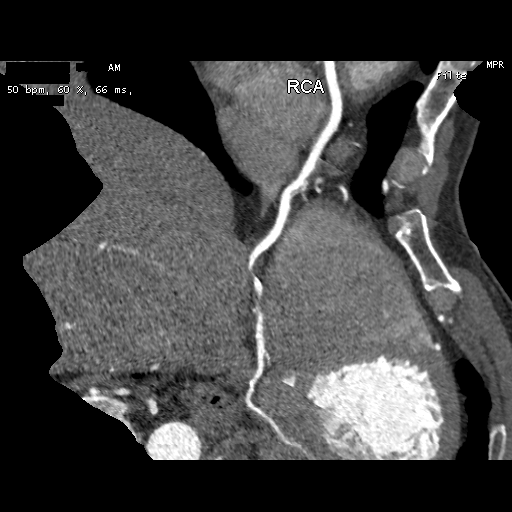
[im 9/11  vessel]
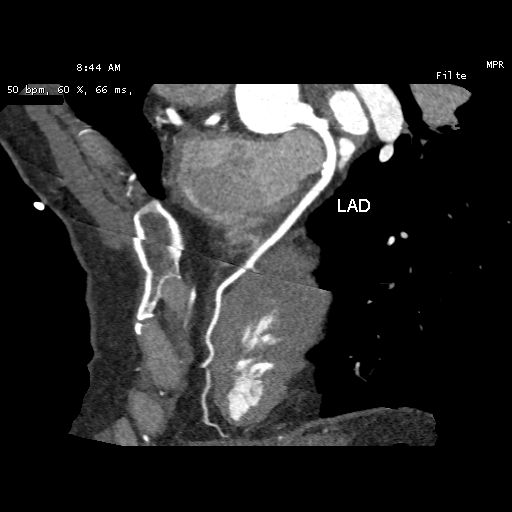

[8 of 20 positions shown; findings below may reference images not displayed]

FINDINGS: Vascular: No significant noncardiac vascular findings.

Mediastinum/Nodes: Visualized mediastinum and hilar regions
demonstrate no lymphadenopathy or masses.

Lungs/Pleura: Stable scarring in the right lower lobe. Visualized
lungs show no evidence of pulmonary edema, consolidation,
pneumothorax, nodule or pleural fluid.

Upper Abdomen: No acute abnormality.

Musculoskeletal: No chest wall mass or suspicious bone lesions
identified.
IMPRESSION: No significant incidental findings. Stable right lower lobe
scarring.
FINDINGS: Image quality: Average.

Artifact: Moderate.

Coronary artery calcification score:

Left main: 0

Left anterior descending artery: 126

Left circumflex artery:

Right coronary artery:

Total coronary calcium score of 215, places the patient at the 95th
percentile for age and sex matched control.

Coronary arteries: Normal coronary origins.  Right dominance.

Left Main Coronary Artery: The left main is a normal caliber vessel
with a normal take off from the left coronary cusp that bifurcates
to form a left anterior descending artery and a left circumflex
artery. There is no plaque or stenosis.

Left Anterior Descending Coronary Artery: Normal caliber vessel,
wraps the apex, gives off 3 patent diagonal branches. Minimal
stenosis (1-24%) proximal LAD due to calcified plaque. Mid to distal
LAD is patent.

Left Circumflex Artery: Normal caliber vessel, non-dominant, travels
within the atrioventricular groove, gives off 1 patent obtuse
marginal branches. The LCX /OM1 is patent with no evidence of plaque
or stenosis.

Right Coronary Artery: The RCA is dominant with normal take off from
the right coronary cusp. The RCA terminates as a PDA and right
posterolateral branch with minimal diffuse irregularities due to
calcified plaque. Mid and distal RCA overall patent but due to
artifact the accuracy is reduced.

Left Atrium: Grossly normal in size with no left atrial appendage
filling defect.

Left Ventricle: Grossly normal in size. There are no stigmata of
prior infarction. There is no abnormal filling defect.

Pulmonary arteries: Normal in size without proximal filling defect.

Pulmonary veins: Normal pulmonary venous drainage.

Aorta: Normal size, 28 mm at the mid ascending aorta (level of the
PA bifurcation) measured double oblique. No calcifications. No
dissection.

Pericardium: Normal thickness with no significant effusion or
calcium present.

Cardiac valves: The aortic valve is trileaflet without significant
calcification. The mitral valve is normal structure without
significant calcification.

Extra-cardiac findings: See attached radiology report for
non-cardiac structures.
IMPRESSION: 1. Total coronary calcium score of 215. This was 95th percentile for
age and sex matched control.

2. Normal coronary origin with right dominance.

3. CAD-RADS = 1 Minimal non-obstructive CAD.

LM: Patent

LAD: Minimal stenosis (1-24%) proximal LAD due to calcified plaque.
Mid to distal LAD is patent.

LCX /OM1: patent with no evidence of plaque or stenosis.

RCA: minimal diffuse irregularities due to calcified plaque. Mid and
distal RCA overall patent but due to artifact the accuracy is
reduced.

RECOMMENDATIONS:

Consider non-atherosclerotic causes of chest pain. Consider
preventive therapy and risk factor modification.

*** End of Addendum ***
EXAM:
OVER-READ INTERPRETATION  CT CHEST

The following report is an over-read performed by radiologist Dr.
over-read does not include interpretation of cardiac or coronary
anatomy or pathology. The coronary CTA interpretation by the
cardiologist is attached.
FINDINGS: Vascular: No significant noncardiac vascular findings.

Mediastinum/Nodes: Visualized mediastinum and hilar regions
demonstrate no lymphadenopathy or masses.

Lungs/Pleura: Stable scarring in the right lower lobe. Visualized
lungs show no evidence of pulmonary edema, consolidation,
pneumothorax, nodule or pleural fluid.

Upper Abdomen: No acute abnormality.

Musculoskeletal: No chest wall mass or suspicious bone lesions
identified.
IMPRESSION: No significant incidental findings. Stable right lower lobe
scarring.

## 2023-02-17 ENCOUNTER — Telehealth: Payer: Self-pay

## 2023-02-17 NOTE — Progress Notes (Unsigned)
PATIENT: Gregory Sanchez DOB: [birth year]  REASON FOR VISIT: follow up HISTORY FROM: patient  No chief complaint on file.    HISTORY OF PRESENT ILLNESS:  02/17/23 ALL:  Gregory Sanchez returns for follow up for OSA on CPAP. He was last seen 05/2022 and having difficulty with FFM. We placed orders for mask refitting and encouraged compliance. Since,   05/28/2022 ALL:  Gregory Sanchez returns for follow up for OSA on CPAP. He was last seen 11/2021 and having difficulty tolerating pressure settings. We reduced max pressure from 16 to 12. Since, he reports pressure settings are much better. He is using therapy about 70% of nights. He has not noted any improvement in sleep quality but admits sleep schedules vary. He may get anywhere from 4-6 hours of sleep each night. He had DOT physical but reports they did not mention CPAP compliance. He would like to try a different mask. He is currently using a FFM and feels he fights with keeping it in place most nights. No significant air leaks noted.     11/27/2021 ALL: Gregory Sanchez is a 57 y.o. male here today for follow up for OSA on CPAP.  He reports doing well until around spring 2023. He states that he is able to start therapy at night and can get to sleep but then wakes up after about 4-5 hours feeling that pressure is too strong and that he can not breathe. He sometimes notes that his heart is racing. He denies dreaming or parasomnias. He admits that usage has fallen off quite a bit. He will lie down at night before starting therapy and fall asleep. 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 He does feel more tired and has more daytime sleepiness.     HISTORY: (copied from Dr Dohmeier's previous note)  He is now seen in a RV . Gregory Sanchez reports that he is still in the process of getting used to his machine he is using a APAP machine with a pressure variability between 6 and 16 cmH2O no EPR his 95th percentile pressure has  been only 6.6 cm.  His 95th percentile air leak is low 2.6 L/min his residual AHI is 1.6 without central apneas emerging.  He has been 97% compliant over the last 30 days and by hours 80% compliant with an average of 4 hours and 50 minutes.  So looking through his hip milligram and compliance download there were some nights when the mask probably slipped and he therefore had a lot of air leakage but on general the mask fits him well and his AHI is excellently controlled I would like to remind that the patient was sent here by Dr. Berenda Morale as read his primary cardiologist and he had a first sleep study in 2016 and on 10-29-2014 had return for CPAP titration.  This time he had complex mostly obstructive sleep apnea with an AHI of 9.3 that was supine dependent no periodic limb movement disorder and just snoring was noted.  So I recommended to try auto titration again the patient seems to have done extremely well he went through an in lab titration on [id number] under the guidance of she now feels respiratory technologist.  Starting at 5 cm water pressure he was titrated up to 16 when he still had some apnea.  He was then switched to BiPAP.  It appears that the sweet spot for his nocturnal titration was actually at 14 cmH2O, however.   I  am happy to see that he is now doing excellent with only 6.6 cm need at night.  So I would offer him a sleep aid if he has trouble falling asleep with the CPAP.  The patient states that his wife has been very happy because he is no longer snoring so There has been some success here his Epworth sleepiness score was decreased to 12 points and his fatigue severity score was not endorsed today.  He sleeps more on his side now, which allowed for low pressure at 955.    REVIEW OF SYSTEMS: Out of a complete 14 system review of symptoms, the patient complains only of the following symptoms, fatigue and all other reviewed systems are negative.  ESS: 8/24, previously  12/24    ALLERGIES: Allergies  Allergen Reactions   Penicillins Hives    Has patient had a PCN reaction causing immediate rash, facial/tongue/throat swelling, SOB or lightheadedness with hypotension: No Has patient had a PCN reaction causing severe rash involving mucus membranes or skin necrosis: No Has patient had a PCN reaction that required hospitalization No Has patient had a PCN reaction occurring within the last 10 years: Yes If all of the above answers are "NO", then may proceed with Cephalosporin use.    HOME MEDICATIONS: Outpatient Medications Prior to Visit  Medication Sig Dispense Refill   amLODipine-benazepril (LOTREL) 10-40 MG capsule Take 1 capsule by mouth daily.     aspirin EC (ASPIRIN LOW DOSE) 81 MG tablet TAKE 1 TABLET(81 MG) BY MOUTH DAILY. SWALLOW WHOLE 30 tablet 11   ergocalciferol (VITAMIN D2) 1.25 MG (50000 UT) capsule Take 50,000 Units by mouth once a week.     Multiple Vitamin (MULTIVITAMIN WITH MINERALS) TABS tablet Take 1 tablet by mouth daily.     pantoprazole (PROTONIX) 40 MG tablet Take 1 tablet (40 mg total) by mouth daily. 30 tablet 1   rosuvastatin (CRESTOR) 20 MG tablet Take 20 mg by mouth daily.     No facility-administered medications prior to visit.    PAST MEDICAL HISTORY: Past Medical History:  Diagnosis Date   GERD (gastroesophageal reflux disease)    no med, diet controlled   Hyperlipidemia    Hypertension    Inguinal hernia    rt   Sleep apnea    "mild" - Does wear  CPAP   Sleep apnea, obstructive    Mild - Does have CPAP    PAST SURGICAL HISTORY: Past Surgical History:  Procedure Laterality Date   COLONOSCOPY     HERNIA REPAIR     POLYPECTOMY     UPPER GASTROINTESTINAL ENDOSCOPY      FAMILY HISTORY: Family History  Problem Relation Age of Onset   Heart disease Mother    Hypertension Mother    Hypertension Father    Colon cancer Neg Hx    Esophageal cancer Neg Hx    Rectal cancer Neg Hx    Stomach cancer Neg Hx     Colon polyps Neg Hx     SOCIAL HISTORY: Social History   Socioeconomic History   Marital status: Single    Spouse name: Not on file   Number of children: Not on file   Years of education: Not on file   Highest education level: Not on file  Occupational History   Not on file  Tobacco Use   Smoking status: Former    Current packs/day: 0.00    Average packs/day: 0.5 packs/day for 27.0 years (13.5 ttl pk-yrs)    Types:  Cigarettes    Start date: 07/06/1985    Quit date: 07/06/2012    Years since quitting: 10.6   Smokeless tobacco: Never  Vaping Use   Vaping status: Every Day  Substance and Sexual Activity   Alcohol use: No   Drug use: No   Sexual activity: Not on file  Other Topics Concern   Not on file  Social History Narrative   Denies caffeine use.   Social Determinants of Health   Financial Resource Strain: Not on file  Food Insecurity: Not on file  Transportation Needs: Not on file  Physical Activity: Not on file  Stress: Not on file  Social Connections: Unknown (07/27/2021)   Received from [facility]   Social Network    Social Network: Not on file  Intimate Partner Violence: Unknown (06/18/2021)   Received from [facility]   HITS    Physically Hurt: Not on file    Insult or Talk Down To: Not on file    Threaten Physical Harm: Not on file    Scream or Curse: Not on file     PHYSICAL EXAM  There were no vitals filed for this visit.   There is no height or weight on file to calculate BMI.  Generalized: Well developed, in no acute distress  Cardiology: normal rate and rhythm, no murmur noted Respiratory: clear to auscultation bilaterally  Neurological examination  Mentation: Alert oriented to time, place, history taking. Follows all commands speech and language fluent Cranial nerve II-XII: Pupils were equal round reactive to light. Extraocular movements were full, visual field were full  Motor: The motor testing reveals  5 over 5 strength of all 4 extremities. Good symmetric motor tone is noted throughout.  Gait and station: Gait is normal.    DIAGNOSTIC DATA (LABS, IMAGING, TESTING) - I reviewed patient records, labs, notes, testing and imaging myself where available.      No data to display           Lab Results  Component Value Date   WBC 5.6 02/16/2019   HGB 13.6 02/16/2019   HCT 40.7 02/16/2019   MCV 87.6 02/16/2019   PLT 327.0 02/16/2019      Component Value Date/Time   NA 138 11/14/2020 1156   K 4.1 11/14/2020 1156   CL 101 11/14/2020 1156   CO2 23 11/14/2020 1156   GLUCOSE 115 (H) 11/14/2020 1156   GLUCOSE 100 (H) 04/27/2015 1642   BUN 16 11/14/2020 1156   CREATININE 1.04 11/14/2020 1156   CALCIUM 9.7 11/14/2020 1156   GFRNONAA >60 04/27/2015 1642   GFRAA >60 04/27/2015 1642   No results found for: "CHOL", "HDL", "LDLCALC", "LDLDIRECT", "TRIG", "CHOLHDL" No results found for: "HGBA1C" No results found for: "VITAMINB12" No results found for: "TSH"   ASSESSMENT AND PLAN 57 y.o. year old male  has a past medical history of GERD (gastroesophageal reflux disease), Hyperlipidemia, Hypertension, Inguinal hernia, Sleep apnea, and Sleep apnea, obstructive. here with   No diagnosis found.   Gregory Sanchez reports lowering pressure settings have significantly helped with waking at night. We have reviewed compliance report revealing improved usage, now 77% daily and 71% fiur hour usage. He was encouraged to continue using CPAP nightly and for greater than 4 hours each night. I have encouraged him to discuss mask options with DME. Mask refitting order placed. We will update supply orders as indicated. Risks of untreated sleep apnea review and education materials provided. Healthy lifestyle habits  encouraged. He will follow up in 6 months, sooner if needed. He verbalizes understanding and agreement with this plan.    No orders of the defined types were placed in this encounter.     No orders of the defined types were placed in this encounter.     Shawnie Dapper, FNP-C 02/17/2023, 11:19 AM [facility] [address], [address] [place], [place] [address] [phone]

## 2023-02-17 NOTE — Telephone Encounter (Signed)
Called pt and asked him to bring his CPAP Machine with him to his appointment on tomorrow 02/18/2023

## 2023-02-17 NOTE — Patient Instructions (Signed)

## 2023-02-18 ENCOUNTER — Encounter: Payer: Self-pay | Admitting: Family Medicine

## 2023-02-18 ENCOUNTER — Ambulatory Visit (INDEPENDENT_AMBULATORY_CARE_PROVIDER_SITE_OTHER): Payer: Self-pay | Admitting: Family Medicine

## 2023-02-18 VITALS — BP 121/72 | HR 78 | Ht 71.0 in | Wt 227.5 lb

## 2023-02-18 DIAGNOSIS — G4733 Obstructive sleep apnea (adult) (pediatric): Secondary | ICD-10-CM

## 2023-04-02 ENCOUNTER — Encounter: Payer: Self-pay | Admitting: Family Medicine

## 2023-04-02 ENCOUNTER — Telehealth: Payer: Self-pay | Admitting: *Deleted

## 2023-04-02 NOTE — Telephone Encounter (Signed)
 Pt stopped by office with SD card for CPAP to provide updated report for Amy to review. I pulled report:

## 2023-10-08 ENCOUNTER — Encounter: Payer: Self-pay | Admitting: Gastroenterology

## 2023-10-08 ENCOUNTER — Ambulatory Visit: Payer: Self-pay | Admitting: Gastroenterology

## 2023-10-08 VITALS — BP 118/62 | HR 62 | Ht 71.0 in | Wt 218.0 lb

## 2023-10-08 DIAGNOSIS — K219 Gastro-esophageal reflux disease without esophagitis: Secondary | ICD-10-CM | POA: Diagnosis not present

## 2023-10-08 DIAGNOSIS — K648 Other hemorrhoids: Secondary | ICD-10-CM

## 2023-10-08 DIAGNOSIS — S76312A Strain of muscle, fascia and tendon of the posterior muscle group at thigh level, left thigh, initial encounter: Secondary | ICD-10-CM | POA: Diagnosis not present

## 2023-10-08 DIAGNOSIS — S76012A Strain of muscle, fascia and tendon of left hip, initial encounter: Secondary | ICD-10-CM

## 2023-10-08 NOTE — Patient Instructions (Addendum)
 Hemorrhoids Recommend high fiber diet No straining with bowel movements If hemorrhoids return contact office, can consider hemorrhoid banding Some patients who prolong sit for work benefit from doughnut pillow, purchase online   Gluteal pain in left buttock, inner thighs Recommend contacting your PCP May consider OTC topical salon pas patches to buttock or  OTC analgesics like Tylenol or ibuprofen  for short period of time until you can see PCP. Always take ibuprofen  with food. Avoid exercise that may exacerbate your symptoms  GERD Continue Pantoprazole  40 mg po daily GERD diet  _______________________________________________________  If your blood pressure at your visit was 140/90 or greater, please contact your primary care physician to follow up on this.  _______________________________________________________  If you are age 23 or older, your body mass index should be between 23-30. Your Body mass index is 30.4 kg/m. If this is out of the aforementioned range listed, please consider follow up with your Primary Care Provider.  If you are age 52 or younger, your body mass index should be between 19-25. Your Body mass index is 30.4 kg/m. If this is out of the aformentioned range listed, please consider follow up with your Primary Care Provider.   ________________________________________________________  The [facility] providers would like to encourage you to use MYCHART to communicate with providers for non-urgent requests or questions.  Due to long hold times on the telephone, sending your provider a message by [facility] may be a faster and more efficient way to get a response.  Please allow 48 business hours for a response.  Please remember that this is for non-urgent requests.  _______________________________________________________  [facility] is using a team-based approach to care.  Your team is made up of your doctor and two to three APPS. Our APPS (Nurse  Practitioners and Physician Assistants) work with your physician to ensure care continuity for you. They are fully qualified to address your health concerns and develop a treatment plan. They communicate directly with your gastroenterologist to care for you. Seeing the Advanced Practice Practitioners on your physician's team can help you by facilitating care more promptly, often allowing for earlier appointments, access to diagnostic testing, procedures, and other specialty referrals.    Thank you for trusting me with your gastrointestinal care. Deanna May, NP-C

## 2023-10-08 NOTE — Progress Notes (Addendum)
 Chief Complaint:rectal and anal pain Primary GI Doctor:(previously Dr. Aneita) Dr. Suzann  HPI:  Patient is a  58  year old male patient with past medical history of GERD, hyperlipidemia, hypertension,sleep apnea, who was referred to me by Loreli Elsie JONETTA Mickey., MD on 09/08/23 for a evaluation of rectal and anal pain.  Last seen in GI office by Elida, NP on 02/16/2019 for epigastric pain.  Interval History  Patient presents for evaluation of left gluteal pain he states started a month ago.   He reports he saw his PCP in [place] for rectal pain. He reported throbbing pain in rectum. He was treated for suspected internal hemorrhoids and given Proctosol HC.  He states this resolved the hemorrhoids and throbbing rectal pain. Patient denies diarrhea and constipation. He takes stool softener as needed. No rectal pain. No blood in stool.     Today he complains of left gluteal and inner thigh pain he reports is separate problem. 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 He reports this caused severe pain to the inner thigh and left glute. He reports this was not mentioned at his last PCP appointment. He has not tried any OTC medications. Reports he think this happened about a month ago.   Patient has history of GERD and controlled with Pantoprazole  40mg  po daily. Patient denies dysphagia. Patient denies nausea, vomiting, or weight loss.  No alcohol use. Nonsmoker.  Surgical history: hernia inguinal  No family history of GI issues or colon CA.   GI Procedures: EGD 04/11/21 - Normal esophagus.  - Normal stomach.  - Normal examined duodenum except for one small scar, likely the prior polypectomy site. Biopsied. Path: Surgical [P], duodenal polyp site (previous) - DUODENAL MUCOSA WITH NO SPECIFIC HISTOPATHOLOGIC CHANGES - NEGATIVE FOR DYSPLASIA OR MALIGNANCY Colonoscopy 04/11/21, recall 7  years  - One 7 mm polyp in the transverse colon, removed with a cold snare. Resected and retrieved.  - One 3 mm polyp in the transverse colon, removed with a cold biopsy forceps. Resected and retrieved.  - The examination was otherwise normal on direct and retroflexion views. Path: Surgical [P], colon, transverse, polyp (2) - TUBULAR ADENOMA WITHOUT HIGH-GRADE DYSPLASIA OR MALIGNANCY - DIMINUTIVE HYPERPLASTIC POLYP - MULTIPLE STEP SECTIONS WERE EXAMINED  Colonoscopy 10/31/2015 by Dr. Aneita:  - One 8 mm polyp at the ileocecal valve, removed with a hot snare.  Biopsies showed benign polypoid mucosa with lymphoid aggregate.  - Two 3 to 5 mm tubular adenomatous polyps in the transverse colon, removed with a cold biopsy forceps. Resected and retrieved. - Internal hemorrhoids. - One 7 mm polyp in the transverse colon, removed with a cold snare. Resected and retrieved. - Melanosis in the colon. -Recall colonoscopy 5 years   Wt Readings from Last 3 Encounters:  10/08/23 218 lb (98.9 kg)  02/18/23 227 lb 8 oz (103.2 kg)  05/28/22 214 lb 8 oz (97.3 kg)    Past Medical History:  Diagnosis Date   GERD (gastroesophageal reflux disease)    no med, diet controlled   Hyperlipidemia    Hypertension    Inguinal hernia    rt   Sleep apnea    mild - Does wear  CPAP   Sleep apnea, obstructive    Mild - Does have CPAP    Past Surgical History:  Procedure Laterality Date   COLONOSCOPY  HERNIA REPAIR     POLYPECTOMY     UPPER GASTROINTESTINAL ENDOSCOPY      Current Outpatient Medications  Medication Sig Dispense Refill   amLODipine-benazepril (LOTREL) 10-40 MG capsule Take 1 capsule by mouth daily.     aspirin  EC (ASPIRIN  LOW DOSE) 81 MG tablet TAKE 1 TABLET(81 MG) BY MOUTH DAILY. SWALLOW WHOLE 30 tablet 11   ergocalciferol (VITAMIN D2) 1.25 MG (50000 UT) capsule Take 50,000 Units by mouth once a week.     Multiple Vitamin (MULTIVITAMIN WITH MINERALS) TABS tablet Take 1 tablet by  mouth daily.     pantoprazole  (PROTONIX ) 40 MG tablet Take 1 tablet (40 mg total) by mouth daily. 30 tablet 1   rosuvastatin (CRESTOR) 20 MG tablet Take 20 mg by mouth daily.     No current facility-administered medications for this visit.    Allergies as of 10/08/2023 - Review Complete 10/08/2023  Allergen Reaction Noted   Penicillins Hives 08/05/2012    Family History  Problem Relation Age of Onset   Heart disease Mother    Hypertension Mother    Hypertension Father    Colon cancer Neg Hx    Esophageal cancer Neg Hx    Rectal cancer Neg Hx    Stomach cancer Neg Hx    Colon polyps Neg Hx     Review of Systems:    Constitutional: No weight loss, fever, chills, weakness or fatigue HEENT: Eyes: No change in vision               Ears, Nose, Throat:  No change in hearing or congestion Skin: No rash or itching Cardiovascular: No chest pain, chest pressure or palpitations   Respiratory: No SOB or cough Gastrointestinal: See HPI and otherwise negative Genitourinary: No dysuria or change in urinary frequency Neurological: No headache, dizziness or syncope Musculoskeletal: No new muscle or joint pain Hematologic: No bleeding or bruising Psychiatric: No history of depression or anxiety    Physical Exam:  Vital signs: BP 118/62   Pulse 62   Ht 5' 11 (1.803 m)   Wt 218 lb (98.9 kg)   BMI 30.40 kg/m   Constitutional:   Pleasant male appears to be in NAD, Well developed, Well nourished, alert and cooperative Respiratory: Respirations even and unlabored. Lungs clear to auscultation bilaterally.   No wheezes, crackles, or rhonchi.  Cardiovascular: Normal S1, S2. Regular rate and rhythm. No peripheral edema, cyanosis or pallor.  Gastrointestinal:  Soft, nondistended, nontender. No rebound or guarding. Normal bowel sounds. No appreciable masses or hepatomegaly.  Rectal: Normal external rectal exam, normal rectal tone,no hemorrhoids noted. non-tender, no masses, , brown stool,  hemoccult N/A  Anoscopy: normal exam, Chaperone Loysie Msk:  Symmetrical without gross deformities. Patient reports pain when palpating left glute. Not red or warm. No sign of infection.  Neurologic:  Alert and  oriented x4;  grossly normal neurologically.  Skin:   Dry and intact without significant lesions or rashes.  RELEVANT LABS AND IMAGING: CBC    Latest Ref Rng & Units 02/16/2019    9:50 AM 04/27/2015    4:42 PM  CBC  WBC 4.0 - 10.5 K/uL 5.6  5.7   Hemoglobin 13.0 - 17.0 g/dL 86.3  86.3   Hematocrit 39.0 - 52.0 % 40.7  40.3   Platelets 150.0 - 400.0 K/uL 327.0  312      CMP     Latest Ref Rng & Units 11/14/2020   11:56 AM 04/27/2015    4:42 PM  CMP  Glucose 65 - 99 mg/dL 884  899   BUN 6 - 24 mg/dL 16  16   Creatinine 9.23 - 1.27 mg/dL 8.95  8.87   Sodium 865 - 144 mmol/L 138  138   Potassium 3.5 - 5.2 mmol/L 4.1  4.1   Chloride 96 - 106 mmol/L 101  102   CO2 20 - 29 mmol/L 23  25   Calcium 8.7 - 10.2 mg/dL 9.7  9.3     Assessment: Encounter Diagnoses  Name Primary?   Gastroesophageal reflux disease, unspecified whether esophagitis present Yes   Internal hemorrhoids    Muscle strain of left gluteal region, initial encounter       58 year old male patient who presents with left gluteal pain and inner thigh pain he reports Keatyn Jawad have been caused from a fall.  Patient does have a history of hemorrhoids and recently treated by his PCP with topical Anusol.  Went ahead and did a full physical examination to evaluate.  External and internal rectal exam was normal.  Patient did express discomfort when pressing on left gluteal muscle and also pointed to his inner thighs.  Suspect this Emalina Dubreuil be related to muscular skeletal sprain or strain.  He is up-to-date on colonoscopy screening with recommendations for recall colonoscopy in Jan 2030.  Recommended high-fiber diet and no straining with bowel movements.  If patient has issues with hemorrhoids again he can come back to our office and we  can discuss alternative therapies.  For the gluteal sprain/strain recommended the patient contact his PCP.  He can try topical analgesics and/or salon pas on glute muscle.  Patient Rheda Kassab also consider oral analgesics for short course. No signs of infection or abscess on exam.   Plan: -Recommend high fiber diet -No straining with bowel movements -If hemorrhoids return contact office, can consider hemorrhoid banding -Recommend contacting your PCP for muscle strain/sprain -Noeh Sparacino consider OTC topical salon pas patches to buttock -OTC analgesics like Tylenol or ibuprofen  prn for short period of time until you can see PCP. Always take ibuprofen  with food. - Recall colonoscopy anywhere in 2030  Thank you for the courtesy of this consult. Please call me with any questions or concerns.   Clayborne Divis, FNP-C [facility] 10/08/2023, 11:24 AM  Cc: Loreli Elsie JONETTA Mickey., MD  I have reviewed the clinic note as outlined by Cathryne Beal, NP and agree with the assessment, plan and medical decision making.  Mr. Winton has a history of rectal pain and was treated by his PCP for hemorrhoids with Anusol suppositories with improvement in symptoms.  There was no evidence of hemorrhoids on physical exam in the office.  Anal fissure could also be another consideration.  Regardless his symptoms are no longer present.  He is up-to-date for colorectal cancer screening.  Agree that gluteal pain is likely musculoskeletal and out of the realm of GI-can follow-up with PCP regarding that issue.  Inocente Hausen, MD

## 2023-10-16 ENCOUNTER — Telehealth: Payer: Self-pay | Admitting: Gastroenterology

## 2023-10-16 NOTE — Telephone Encounter (Signed)
 Received call from [facility] stating patient sent a message to them regarding being referred by Deanna to have scans completed. Advised that there was no mention of scans in last office visit notes. Requesting a call back at [phone]. Please advise, Thank you

## 2023-10-17 NOTE — Telephone Encounter (Signed)
 Returned call to [facility] and left a message for Dr. Joleen cma stating that patient doesn't have a reason for scans and we did not send any referrals for scans.

## 2024-02-24 ENCOUNTER — Ambulatory Visit: Payer: BLUE CROSS/BLUE SHIELD | Admitting: Family Medicine

## 2024-02-24 ENCOUNTER — Encounter: Payer: Self-pay | Admitting: Family Medicine

## 2024-02-24 ENCOUNTER — Telehealth: Payer: Self-pay

## 2024-02-24 VITALS — Ht 71.0 in | Wt 220.0 lb

## 2024-02-24 DIAGNOSIS — E8881 Metabolic syndrome: Secondary | ICD-10-CM | POA: Insufficient documentation

## 2024-02-24 DIAGNOSIS — E1169 Type 2 diabetes mellitus with other specified complication: Secondary | ICD-10-CM | POA: Insufficient documentation

## 2024-02-24 DIAGNOSIS — G4733 Obstructive sleep apnea (adult) (pediatric): Secondary | ICD-10-CM

## 2024-02-24 NOTE — Progress Notes (Signed)
 PATIENT: Gregory Sanchez DOB: 05/21/1965  REASON FOR VISIT: follow up HISTORY FROM: patient  Chief Complaint  Patient presents with   RM1/CPAP    Pt is here Alone. Pt states that things are going good with his CPAP Machine.      HISTORY OF PRESENT ILLNESS:  02/24/24 ALL:  Gregory Sanchez returns for follow up for OSA on CPAP. He continues to do well on therapy. He uses therapy most nights. He does feel better rested when he is able to sleep longer, usually on the weekends. He denies concerns with machine or supplies.     02/18/2023 ALL:  Gregory Sanchez returns for follow up for OSA on CPAP. He was last seen 05/2022 and having difficulty with FFM. We placed orders for mask refitting and encouraged compliance. Since, he reports doing well. Compliance has remained acceptable. Leak significantly improved. 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     05/28/2022 ALL:  Gregory Sanchez returns for follow up for OSA on CPAP. He was last seen 11/2021 and having difficulty tolerating pressure settings. We reduced max pressure from 16 to 12. Since, he reports pressure settings are much better. He is using therapy about 70% of nights. He has not noted any improvement in sleep quality but admits sleep schedules vary. He may get anywhere from 4-6 hours of sleep each night. He had DOT physical but reports they did not mention CPAP compliance. He would like to try a different mask. He is currently using a FFM and feels he fights with keeping it in place most nights. No significant air leaks noted.     11/27/2021 ALL: Gregory Sanchez is a 58 y.o. male here today for follow up for OSA on CPAP.  He reports doing well until around spring 2023. He states that he is able to start therapy at night and can get to sleep but then wakes up after about 4-5 hours feeling that pressure is too strong and that he can not breathe. He sometimes notes that his  heart is racing. He denies dreaming or parasomnias. He admits that usage has fallen off quite a bit. He will lie down at night before starting therapy and fall asleep. 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 He does feel more tired and has more daytime sleepiness.     HISTORY: (copied from Dr Dohmeier's previous note)  He is now seen in a RV . Gregory Sanchez reports that he is still in the process of getting used to his machine he is using a APAP machine with a pressure variability between 6 and 16 cmH2O no EPR his 95th percentile pressure has been only 6.6 cm.  His 95th percentile air leak is low 2.6 L/min his residual AHI is 1.6 without central apneas emerging.  He has been 97% compliant over the last 30 days and by hours 80% compliant with an average of 4 hours and 50 minutes.  So looking through his hip milligram and compliance download there were some nights when the mask probably slipped and he therefore had a lot of air leakage but on general the mask fits him well and his AHI is excellently controlled I would like to remind that the patient was sent here by Dr. PAMALA as read his primary cardiologist and he had a first sleep study in 2016 and on 10-29-2014 had return for CPAP titration.  This time he had complex mostly obstructive sleep apnea with an AHI of 9.3 that was supine dependent no periodic limb movement disorder and just snoring was noted.  So I recommended to try auto titration again the patient seems to have done extremely well he went through an in lab titration on [id number] under the guidance of she now feels respiratory technologist.  Starting at 5 cm water pressure he was titrated up to 16 when he still had some apnea.  He was then switched to BiPAP.  It appears that the sweet spot for his nocturnal titration was actually at 14 cmH2O, however.   I am happy to see that he is now doing excellent with only 6.6 cm need at night.  So I would offer him a sleep aid if he has  trouble falling asleep with the CPAP.  The patient states that his wife has been very happy because he is no longer snoring so There has been some success here his Epworth sleepiness score was decreased to 12 points and his fatigue severity score was not endorsed today.  He sleeps more on his side now, which allowed for low pressure at 955.    REVIEW OF SYSTEMS: Out of a complete 14 system review of symptoms, the patient complains only of the following symptoms, fatigue and all other reviewed systems are negative.  ESS: 4/24, previously 8/24   ALLERGIES: Allergies  Allergen Reactions   Penicillins Hives    Has patient had a PCN reaction causing immediate rash, facial/tongue/throat swelling, SOB or lightheadedness with hypotension: No Has patient had a PCN reaction causing severe rash involving mucus membranes or skin necrosis: No Has patient had a PCN reaction that required hospitalization No Has patient had a PCN reaction occurring within the last 10 years: Yes If all of the above answers are NO, then may proceed with Cephalosporin use.    HOME MEDICATIONS: Outpatient Medications Prior to Visit  Medication Sig Dispense Refill   amLODipine-benazepril (LOTREL) 10-40 MG capsule Take 1 capsule by mouth daily.     aspirin  EC (ASPIRIN  LOW DOSE) 81 MG tablet TAKE 1 TABLET(81 MG) BY MOUTH DAILY. SWALLOW WHOLE 30 tablet 11   ergocalciferol (VITAMIN D2) 1.25 MG (50000 UT) capsule Take 50,000 Units by mouth once a week.     metFORMIN (GLUCOPHAGE-XR) 500 MG 24 hr tablet Take 500 mg by mouth.     Multiple Vitamin (MULTIVITAMIN WITH MINERALS) TABS tablet Take 1 tablet by mouth daily.     pantoprazole  (PROTONIX ) 40 MG tablet Take 1 tablet (40 mg total) by mouth daily. 30 tablet 1   rosuvastatin (CRESTOR) 20 MG tablet Take 20 mg by mouth daily.     No facility-administered medications prior to visit.    PAST MEDICAL HISTORY: Past Medical History:  Diagnosis Date   GERD (gastroesophageal  reflux disease)    no med, diet controlled   Hyperlipidemia    Hypertension    Inguinal hernia    rt   Sleep apnea    mild - Does wear  CPAP   Sleep apnea, obstructive    Mild - Does have CPAP    PAST SURGICAL HISTORY: Past Surgical History:  Procedure Laterality Date   COLONOSCOPY     HERNIA REPAIR     POLYPECTOMY     UPPER GASTROINTESTINAL ENDOSCOPY      FAMILY HISTORY: Family History  Problem Relation Age of Onset   Heart disease Mother    Hypertension Mother    Hypertension  Father    Colon cancer Neg Hx    Esophageal cancer Neg Hx    Rectal cancer Neg Hx    Stomach cancer Neg Hx    Colon polyps Neg Hx     SOCIAL HISTORY: Social History   Socioeconomic History   Marital status: Single    Spouse name: Not on file   Number of children: Not on file   Years of education: Not on file   Highest education level: Not on file  Occupational History   Not on file  Tobacco Use   Smoking status: Former    Current packs/day: 0.00    Average packs/day: 0.5 packs/day for 27.0 years (13.5 ttl pk-yrs)    Types: Cigarettes    Start date: 07/06/1985    Quit date: 07/06/2012    Years since quitting: 11.6   Smokeless tobacco: Never  Vaping Use   Vaping status: Every Day  Substance and Sexual Activity   Alcohol use: No   Drug use: No   Sexual activity: Not on file  Other Topics Concern   Not on file  Social History Narrative   Denies caffeine use.   Social Drivers of Corporate Investment Banker Strain: Not on file  Food Insecurity: Not on file  Transportation Needs: Not on file  Physical Activity: Not on file  Stress: Not on file  Social Connections: Unknown (07/27/2021)   Received from [facility]   Social Network    Social Network: Not on file  Intimate Partner Violence: Unknown (06/18/2021)   Received from [facility]   HITS    Physically Hurt: Not on file    Insult or Talk Down To: Not on file    Threaten Physical Harm: Not on file    Scream or  Curse: Not on file     PHYSICAL EXAM  Vitals:   02/24/24 1349  Weight: 220 lb (99.8 kg)  Height: 5' 11 (1.803 m)      Body mass index is 30.68 kg/m.  Generalized: Well developed, in no acute distress  Cardiology: normal rate and rhythm, no murmur noted Respiratory: clear to auscultation bilaterally  Neurological examination  Mentation: Alert oriented to time, place, history taking. Follows all commands speech and language fluent Cranial nerve II-XII: Pupils were equal round reactive to light. Extraocular movements were full, visual field were full  Motor: The motor testing reveals 5 over 5 strength of all 4 extremities. Good symmetric motor tone is noted throughout.  Gait and station: Gait is normal.    DIAGNOSTIC DATA (LABS, IMAGING, TESTING) - I reviewed patient records, labs, notes, testing and imaging myself where available.      No data to display           Lab Results  Component Value Date   WBC 5.6 02/16/2019   HGB 13.6 02/16/2019   HCT 40.7 02/16/2019   MCV 87.6 02/16/2019   PLT 327.0 02/16/2019      Component Value Date/Time   NA 138 11/14/2020 1156   K 4.1 11/14/2020 1156   CL 101 11/14/2020 1156   CO2 23 11/14/2020 1156   GLUCOSE 115 (H) 11/14/2020 1156   GLUCOSE 100 (H) 04/27/2015 1642   BUN 16 11/14/2020 1156   CREATININE 1.04 11/14/2020 1156   CALCIUM 9.7 11/14/2020 1156   GFRNONAA >60 04/27/2015 1642   GFRAA >60 04/27/2015 1642   No results found for: CHOL, HDL, LDLCALC, LDLDIRECT, TRIG, CHOLHDL No results found for: YHAJ8R No results found  for: VITAMINB12 No results found for: TSH   ASSESSMENT AND PLAN 58 y.o. year old male  has a past medical history of GERD (gastroesophageal reflux disease), Hyperlipidemia, Hypertension, Inguinal hernia, Sleep apnea, and Sleep apnea, obstructive. here with     ICD-10-CM   1. Obstructive sleep apnea on CPAP  G47.33 For home use only DME continuous positive airway pressure (CPAP)       Gregory Sanchez reports doing well. Compliance report reveals acceptable usage, now 87% daily and 72% four hour usage. Discussed DOT requirements. He was encouraged to continue using CPAP nightly and for greater than 4 hours each night. We will update supply orders as indicated. Risks of untreated sleep apnea review and education materials provided. Healthy lifestyle habits encouraged. He will follow up in 6 months, sooner if needed. He verbalizes understanding and agreement with this plan.    Orders Placed This Encounter  Procedures   For home use only DME continuous positive airway pressure (CPAP)    Heated Humidity with all supplies as needed    Length of Need:   Lifetime    Patient has OSA or probable OSA:   Yes    Is the patient currently using CPAP in the home:   Yes    Settings:   Other see comments    CPAP supplies needed:   Mask, headgear, cushions, filters, heated tubing and water chamber     No orders of the defined types were placed in this encounter.     Gregory Forbes, FNP-C 02/24/2024, 2:17 PM [facility] [address], [address] [place], [place] [address] [phone]

## 2024-02-24 NOTE — Patient Instructions (Signed)

## 2024-02-24 NOTE — Telephone Encounter (Signed)
 Order sent to dme

## 2025-03-08 ENCOUNTER — Ambulatory Visit: Admitting: Family Medicine
